# Patient Record
Sex: Male | Born: 1965 | Race: White | Hispanic: No | Marital: Married | Smoking: Current every day smoker
Health system: Southern US, Community
[De-identification: ages and names within clinical notes are randomized; demographics above are authoritative.]

## PROBLEM LIST (undated history)

## (undated) DIAGNOSIS — I10 Essential (primary) hypertension: Secondary | ICD-10-CM

## (undated) DIAGNOSIS — F988 Other specified behavioral and emotional disorders with onset usually occurring in childhood and adolescence: Secondary | ICD-10-CM

## (undated) DIAGNOSIS — F419 Anxiety disorder, unspecified: Secondary | ICD-10-CM

## (undated) DIAGNOSIS — M1990 Unspecified osteoarthritis, unspecified site: Secondary | ICD-10-CM

## (undated) DIAGNOSIS — D649 Anemia, unspecified: Secondary | ICD-10-CM

## (undated) DIAGNOSIS — T7840XA Allergy, unspecified, initial encounter: Secondary | ICD-10-CM

## (undated) HISTORY — DX: Allergy, unspecified, initial encounter: T78.40XA

## (undated) HISTORY — DX: Unspecified osteoarthritis, unspecified site: M19.90

## (undated) HISTORY — DX: Other specified behavioral and emotional disorders with onset usually occurring in childhood and adolescence: F98.8

## (undated) HISTORY — DX: Anemia, unspecified: D64.9

## (undated) HISTORY — DX: Anxiety disorder, unspecified: F41.9

---

## 2008-09-26 ENCOUNTER — Encounter: Admission: RE | Admit: 2008-09-26 | Discharge: 2008-09-26 | Payer: Self-pay | Admitting: Geriatric Medicine

## 2009-04-25 ENCOUNTER — Emergency Department (HOSPITAL_COMMUNITY): Admission: EM | Admit: 2009-04-25 | Discharge: 2009-04-26 | Payer: Self-pay | Admitting: Emergency Medicine

## 2014-01-05 ENCOUNTER — Ambulatory Visit (INDEPENDENT_AMBULATORY_CARE_PROVIDER_SITE_OTHER): Payer: BC Managed Care – PPO | Admitting: Emergency Medicine

## 2014-01-05 VITALS — BP 138/100 | HR 127 | Temp 99.1°F | Resp 18 | Ht 65.0 in | Wt 143.0 lb

## 2014-01-05 DIAGNOSIS — IMO0001 Reserved for inherently not codable concepts without codable children: Secondary | ICD-10-CM

## 2014-01-05 DIAGNOSIS — R030 Elevated blood-pressure reading, without diagnosis of hypertension: Secondary | ICD-10-CM

## 2014-01-05 DIAGNOSIS — J209 Acute bronchitis, unspecified: Secondary | ICD-10-CM

## 2014-01-05 DIAGNOSIS — J0140 Acute pansinusitis, unspecified: Secondary | ICD-10-CM

## 2014-01-05 MED ORDER — AMOXICILLIN-POT CLAVULANATE 875-125 MG PO TABS: 1.0000 | ORAL_TABLET | Freq: Two times a day (BID) | ORAL | Status: DC

## 2014-01-05 MED ORDER — HYDROCOD POLST-CHLORPHEN POLST 10-8 MG/5ML PO LQCR: 5.0000 mL | Freq: Two times a day (BID) | ORAL | Status: DC | PRN

## 2014-01-05 NOTE — Patient Instructions (Signed)
Avpod sudafed containing decongestants   Hypertension Hypertension, commonly called high blood pressure, is when the force of blood pumping through your arteries is too strong. Your arteries are the blood vessels that carry blood from your heart throughout your body. A blood pressure reading consists of a higher number over a lower number, such as 110/72. The higher number (systolic) is the pressure inside your arteries when your heart pumps. The lower number (diastolic) is the pressure inside your arteries when your heart relaxes. Ideally you want your blood pressure below 120/80. Hypertension forces your heart to work harder to pump blood. Your arteries may become narrow or stiff. Having hypertension puts you at risk for heart disease, stroke, and other problems.  RISK FACTORS Some risk factors for high blood pressure are controllable. Others are not.  Risk factors you cannot control include:   Race. You may be at higher risk if you are African American.  Age. Risk increases with age.  Gender. Men are at higher risk than women before age 46 years. After age 46, women are at higher risk than men. Risk factors you can control include:  Not getting enough exercise or physical activity.  Being overweight.  Getting too much fat, sugar, calories, or salt in your diet.  Drinking too much alcohol. SIGNS AND SYMPTOMS Hypertension does not usually cause signs or symptoms. Extremely high blood pressure (hypertensive crisis) may cause headache, anxiety, shortness of breath, and nosebleed. DIAGNOSIS  To check if you have hypertension, your health care provider will measure your blood pressure while you are seated, with your arm held at the level of your heart. It should be measured at least twice using the same arm. Certain conditions can cause a difference in blood pressure between your right and left arms. A blood pressure reading that is higher than normal on one occasion does not mean that you  need treatment. If one blood pressure reading is high, ask your health care provider about having it checked again. TREATMENT  Treating high blood pressure includes making lifestyle changes and possibly taking medicine. Living a healthy lifestyle can help lower high blood pressure. You may need to change some of your habits. Lifestyle changes may include:  Following the DASH diet. This diet is high in fruits, vegetables, and whole grains. It is low in salt, red meat, and added sugars.  Getting at least 2 hours of brisk physical activity every week.  Losing weight if necessary.  Not smoking.  Limiting alcoholic beverages.  Learning ways to reduce stress. If lifestyle changes are not enough to get your blood pressure under control, your health care provider may prescribe medicine. You may need to take more than one. Work closely with your health care provider to understand the risks and benefits. HOME CARE INSTRUCTIONS  Have your blood pressure rechecked as directed by your health care provider.   Take medicines only as directed by your health care provider. Follow the directions carefully. Blood pressure medicines must be taken as prescribed. The medicine does not work as well when you skip doses. Skipping doses also puts you at risk for problems.   Do not smoke.   Monitor your blood pressure at home as directed by your health care provider. SEEK MEDICAL CARE IF:   You think you are having a reaction to medicines taken.  You have recurrent headaches or feel dizzy.  You have swelling in your ankles.  You have trouble with your vision. SEEK IMMEDIATE MEDICAL CARE IF:  You  develop a severe headache or confusion.  You have unusual weakness, numbness, or feel faint.  You have severe chest or abdominal pain.  You vomit repeatedly.  You have trouble breathing. MAKE SURE YOU:   Understand these instructions.  Will watch your condition.  Will get help right away if you  are not doing well or get worse. Document Released: 12/31/2004 Document Revised: 05/17/2013 Document Reviewed: 10/23/2012 [facility] Patient Information 2015 [place], [place]. This information is not intended to replace advice given to you by your health care provider. Make sure you discuss any questions you have with your health care provider.

## 2014-01-05 NOTE — Progress Notes (Signed)
[facility] [address], [place] [id number] [phone]  Date:  01/05/2014   Name:  Walter DayMichael Jeff   DOB:  Apr 13, 1965   MRN:  [id number]  PCP:  No PCP Per Patient    Chief Complaint: Cough and Nasal Congestion   History of Present Illness:  Walter Snow is a 48 y.o. very pleasant male patient who presents with the following:  Has three day history of sudden purulent nasal drainage and post nasal drip.  No fever or chills Has a sore throat Cough is productive of purulent sputum.  No wheezing or shortness of breath. No nausea o r vomiting Using sudafed in large amounts. Denies other complaint or health concern today.   There are no active problems to display for this patient.   Past Medical History  Diagnosis Date  . Allergy   . Anemia   . Anxiety   . Arthritis     History reviewed. No pertinent past surgical history.  History  Substance Use Topics  . Smoking status: Current Every Day Smoker -- 1.00 packs/day for 30 years    Types: Cigarettes  . Smokeless tobacco: Not on file  . Alcohol Use: 0.0 oz/week    0 Not specified per week    Family History  Problem Relation Age of Onset  . Cancer Father   . Heart disease Father     Allergies  Allergen Reactions  . Penicillins Hives    Medication list has been reviewed and updated.  No current outpatient prescriptions on file prior to visit.   No current facility-administered medications on file prior to visit.    Review of Systems:  As per HPI, otherwise negative.    Physical Examination: Filed Vitals:   01/05/14 1350  BP: 138/100  Pulse: 127  Temp: 99.1 F (37.3 C)  Resp: 18   Filed Vitals:   01/05/14 1350  Height: 5\' 5"  (1.651 m)  Weight: 143 lb (64.864 kg)   Body mass index is 23.8 kg/(m^2). Ideal Body Weight: Weight in (lb) to have BMI = 25: 149.9  GEN: WDWN, NAD, Non-toxic, A & O x 3  persistent cough HEENT: Atraumatic, Normocephalic. Neck supple. No masses,  No LAD. Ears and Nose: No external deformity. CV: RRR, No M/G/R. No JVD. No thrill. No extra heart sounds. PULM: CTA B, no wheezes, crackles, rhonchi. No retractions. No resp. distress. No accessory muscle use. ABD: S, NT, ND, +BS. No rebound. No HSM. EXTR: No c/c/e NEURO Normal gait.  PSYCH: Normally interactive. Conversant. Not depressed or anxious appearing.  Calm demeanor.    Assessment and Plan: Elevated BP could be due sympathomimetics such as sudafed Sinusitis Broncitis.  augmentin tussionex To 104  Signed,  Phillips OdorJeffery Dimitrious Micciche, MD

## 2014-01-05 NOTE — Addendum Note (Signed)
Addended by: Carmelina DaneANDERSON, Brandye Inthavong S on: 01/05/2014 03:07 PM   Modules accepted: Level of Service

## 2014-01-11 NOTE — Progress Notes (Signed)
Left message for patient to call back to schedule with a primary care provider to establish care.

## 2014-01-19 ENCOUNTER — Encounter: Payer: Self-pay | Admitting: Family Medicine

## 2014-01-19 ENCOUNTER — Ambulatory Visit (INDEPENDENT_AMBULATORY_CARE_PROVIDER_SITE_OTHER): Payer: BLUE CROSS/BLUE SHIELD | Admitting: Family Medicine

## 2014-01-19 VITALS — BP 149/97 | HR 106 | Temp 98.4°F | Resp 18 | Ht 65.5 in | Wt 144.2 lb

## 2014-01-19 DIAGNOSIS — F17200 Nicotine dependence, unspecified, uncomplicated: Secondary | ICD-10-CM

## 2014-01-19 DIAGNOSIS — Z720 Tobacco use: Secondary | ICD-10-CM

## 2014-01-19 DIAGNOSIS — I10 Essential (primary) hypertension: Secondary | ICD-10-CM

## 2014-01-19 NOTE — Patient Instructions (Signed)
Come in for blood work in the next couple of days

## 2014-01-21 DIAGNOSIS — I10 Essential (primary) hypertension: Secondary | ICD-10-CM | POA: Insufficient documentation

## 2014-01-21 DIAGNOSIS — F17200 Nicotine dependence, unspecified, uncomplicated: Secondary | ICD-10-CM | POA: Insufficient documentation

## 2014-01-21 NOTE — Progress Notes (Signed)
Subjective:    Patient ID: Walter Snow, male    DOB: [birth year], 49 y.o.   MRN: [id number]  HPI Patient presents today for follow up of elevated BP. He was seen 12/23 for URI and was noted to have an elevated BP. He has had elevated readings in the past. He has a strong family history of elevated BP. He has tried limiting salt intake without improvement in his BP. He rarely eats out and prepares most of his food from scratch.   He works at [employer] and estimates he walks about 5 miles a day. He is a longtime smoker. He occasionally takes adderall for ADHD. 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 He rarely takes it, and reports he is able to function without difficulty at work and his relationships are currently healthy. He rarely uses ativan for sleep. Is currently sleeping well and stress level is manageable.    Past Medical History  Diagnosis Date  . Allergy   . Anemia   . Anxiety   . Arthritis    History reviewed. No pertinent past surgical history. Family History  Problem Relation Age of Onset  . Cancer Father   . Heart disease Father    History  Substance Use Topics  . Smoking status: Current Every Day Smoker -- 1.00 packs/day for 30 years    Types: Cigarettes  . Smokeless tobacco: Not on file  . Alcohol Use: 0.0 oz/week    0 Not specified per week    Review of Systems  Constitutional: Negative for fatigue and unexpected weight change.  Respiratory: Negative for cough, chest tightness, shortness of breath and wheezing.   Cardiovascular: Negative for chest pain, palpitations and leg swelling.  Neurological: Negative for headaches.  Psychiatric/Behavioral: Negative for dysphoric mood. The patient is not nervous/anxious and is not hyperactive.       Objective:   Physical Exam  Constitutional: He is oriented to person, place, and time. He appears  well-developed and well-nourished.  HENT:  Head: Normocephalic and atraumatic.  Right Ear: External ear normal.  Left Ear: External ear normal.  Nose: Nose normal.  Mouth/Throat: Oropharynx is clear and moist.  Eyes: Conjunctivae are normal.  Neck: Normal range of motion. Neck supple. No JVD present. No thyromegaly present.  Cardiovascular: Normal rate, regular rhythm and normal heart sounds.   Pulmonary/Chest: Effort normal and breath sounds normal.  Musculoskeletal: Normal range of motion.  Lymphadenopathy:    He has no cervical adenopathy.  Neurological: He is alert and oriented to person, place, and time.  Skin: Skin is warm and dry.  Psychiatric: He has a normal mood and affect. His behavior is normal. Judgment and thought content normal.  Vitals reviewed. BP 149/97 mmHg  Pulse 106  Temp(Src) 98.4 F (36.9 C) (Oral)  Resp 18  Ht 5' 5.5" (1.664 m)  Wt 144 lb 3.2 oz (65.409 kg)  BMI 23.62 kg/m2  SpO2 100% Recheck BP 158/98    Assessment & Plan:  1. Essential hypertension - Patient not fasting, will come in tomorrow or Friday for fasting labs, after reviewing labs, will start antihypertensive agent -Discussed side effects of Adderall and encouraged patient to discontinue since he is functioning well when he doesn't take it. - CBC; Future - Comprehensive metabolic panel; Future - TSH; Future - Lipid panel; Future  2. Tobacco use disorder - CBC; Future - Comprehensive  metabolic panel; Future - TSH; Future - Lipid panel; Future - Encouraged smoking cessation  -f/u 3 months Emi Belfasteborah B. Harlo Jaso, FNP-BC  [facility]  01/21/2014 9:55 AM

## 2014-01-26 ENCOUNTER — Other Ambulatory Visit (INDEPENDENT_AMBULATORY_CARE_PROVIDER_SITE_OTHER): Payer: BLUE CROSS/BLUE SHIELD | Admitting: Family Medicine

## 2014-01-26 DIAGNOSIS — F17200 Nicotine dependence, unspecified, uncomplicated: Secondary | ICD-10-CM

## 2014-01-26 DIAGNOSIS — I10 Essential (primary) hypertension: Secondary | ICD-10-CM

## 2014-01-26 LAB — COMPREHENSIVE METABOLIC PANEL
ALBUMIN: 4 g/dL (ref 3.5–5.2)
ALK PHOS: 96 U/L (ref 39–117)
ALT: 17 U/L (ref 0–53)
AST: 24 U/L (ref 0–37)
BILIRUBIN TOTAL: 0.6 mg/dL (ref 0.2–1.2)
BUN: 20 mg/dL (ref 6–23)
CO2: 29 meq/L (ref 19–32)
Calcium: 9.8 mg/dL (ref 8.4–10.5)
Chloride: 103 mEq/L (ref 96–112)
Creat: 1.05 mg/dL (ref 0.50–1.35)
GLUCOSE: 79 mg/dL (ref 70–99)
POTASSIUM: 5.3 meq/L (ref 3.5–5.3)
SODIUM: 138 meq/L (ref 135–145)
TOTAL PROTEIN: 7 g/dL (ref 6.0–8.3)

## 2014-01-26 LAB — CBC
HEMATOCRIT: 43.7 % (ref 39.0–52.0)
HEMOGLOBIN: 15 g/dL (ref 13.0–17.0)
MCH: 32.3 pg (ref 26.0–34.0)
MCHC: 34.3 g/dL (ref 30.0–36.0)
MCV: 94 fL (ref 78.0–100.0)
MPV: 10.4 fL (ref 8.6–12.4)
Platelets: 264 10*3/uL (ref 150–400)
RBC: 4.65 MIL/uL (ref 4.22–5.81)
RDW: 12.8 % (ref 11.5–15.5)
WBC: 6.1 10*3/uL (ref 4.0–10.5)

## 2014-01-26 LAB — LIPID PANEL
CHOL/HDL RATIO: 5.4 ratio
CHOLESTEROL: 223 mg/dL — AB (ref 0–200)
HDL: 41 mg/dL (ref >39)
LDL Cholesterol: 134 mg/dL — ABNORMAL HIGH (ref 0–99)
Triglycerides: 242 mg/dL — ABNORMAL HIGH (ref <150)
VLDL: 48 mg/dL — AB (ref 0–40)

## 2014-01-26 LAB — TSH: TSH: 2.842 u[IU]/mL (ref 0.350–4.500)

## 2014-01-27 ENCOUNTER — Other Ambulatory Visit: Payer: Self-pay | Admitting: Family Medicine

## 2014-01-27 DIAGNOSIS — I10 Essential (primary) hypertension: Secondary | ICD-10-CM

## 2014-01-27 MED ORDER — LISINOPRIL 10 MG PO TABS: 10.0000 mg | ORAL_TABLET | Freq: Every day | ORAL | Status: DC

## 2014-08-29 ENCOUNTER — Encounter (HOSPITAL_COMMUNITY): Payer: Self-pay | Admitting: Emergency Medicine

## 2014-08-29 ENCOUNTER — Emergency Department (HOSPITAL_COMMUNITY)
Admission: EM | Admit: 2014-08-29 | Discharge: 2014-08-29 | Disposition: A | Discharge status: Home / Self Care | Payer: Self-pay | Attending: Emergency Medicine | Admitting: Emergency Medicine

## 2014-08-29 DIAGNOSIS — Z23 Encounter for immunization: Secondary | ICD-10-CM | POA: Insufficient documentation

## 2014-08-29 DIAGNOSIS — S61012A Laceration without foreign body of left thumb without damage to nail, initial encounter: Secondary | ICD-10-CM | POA: Insufficient documentation

## 2014-08-29 DIAGNOSIS — W260XXA Contact with knife, initial encounter: Secondary | ICD-10-CM | POA: Insufficient documentation

## 2014-08-29 DIAGNOSIS — I10 Essential (primary) hypertension: Secondary | ICD-10-CM | POA: Insufficient documentation

## 2014-08-29 DIAGNOSIS — Z79899 Other long term (current) drug therapy: Secondary | ICD-10-CM | POA: Insufficient documentation

## 2014-08-29 DIAGNOSIS — Z862 Personal history of diseases of the blood and blood-forming organs and certain disorders involving the immune mechanism: Secondary | ICD-10-CM | POA: Insufficient documentation

## 2014-08-29 DIAGNOSIS — Z720 Tobacco use: Secondary | ICD-10-CM | POA: Insufficient documentation

## 2014-08-29 DIAGNOSIS — Z880 Allergy status to penicillin: Secondary | ICD-10-CM | POA: Insufficient documentation

## 2014-08-29 DIAGNOSIS — Y998 Other external cause status: Secondary | ICD-10-CM | POA: Insufficient documentation

## 2014-08-29 DIAGNOSIS — Y9389 Activity, other specified: Secondary | ICD-10-CM | POA: Insufficient documentation

## 2014-08-29 DIAGNOSIS — F419 Anxiety disorder, unspecified: Secondary | ICD-10-CM | POA: Insufficient documentation

## 2014-08-29 DIAGNOSIS — Y9289 Other specified places as the place of occurrence of the external cause: Secondary | ICD-10-CM | POA: Insufficient documentation

## 2014-08-29 HISTORY — DX: Essential (primary) hypertension: I10

## 2014-08-29 MED ORDER — LIDOCAINE HCL (PF) 1 % IJ SOLN
5.0000 mL | Freq: Once | INTRAMUSCULAR | Status: AC
  Administered 2014-08-29: 5 mL
  Filled 2014-08-29: qty 5

## 2014-08-29 MED ORDER — TETANUS-DIPHTH-ACELL PERTUSSIS 5-2.5-18.5 LF-MCG/0.5 IM SUSP
0.5000 mL | Freq: Once | INTRAMUSCULAR | Status: AC
  Administered 2014-08-29: 0.5 mL via INTRAMUSCULAR
  Filled 2014-08-29: qty 0.5

## 2014-08-29 NOTE — ED Notes (Signed)
Patient states he was cutting wood with a utility knife and cut the left thumb. Patient denies any blood thinner. Patient alert& O x4 . Bleeding controlled. Patient able to still move extremity. Laceration noted about 1.5 cm.

## 2014-08-29 NOTE — ED Provider Notes (Signed)
CSN: [id number]     Arrival date & time 08/29/14  1721 History  This chart was scribed for Zadie Rhine, MD by Leone Payor, ED Scribe. This patient was seen in room TR06C/TR06C and the patient's care was started 5:35 PM.    Chief Complaint  Patient presents with  . Extremity Laceration   The history is provided by the patient. No language interpreter was used.     HPI Comments: Walter Snow is a 49 y.o. male who presents to the Emergency Department complaining of a laceration to the left thumb that occurred PTA. He reports using a utility knife when the injury occurred. He reports associated mild pain to the affected area which is worse with touch. He does not take anti-coagulants. His tetanus is out of date. Bleeding is controlled. He denies numbness, weakness.   Past Medical History  Diagnosis Date  . Allergy   . Anemia   . Anxiety   . Arthritis   . Hypertension    History reviewed. No pertinent past surgical history. Family History  Problem Relation Age of Onset  . Cancer Father   . Heart disease Father    Social History  Substance Use Topics  . Smoking status: Current Every Day Smoker -- 1.00 packs/day for 30 years    Types: Cigarettes  . Smokeless tobacco: None  . Alcohol Use: 0.0 oz/week    0 Standard drinks or equivalent per week    Review of Systems  Skin: Positive for wound (laceration to left thumb).  Neurological: Negative for weakness and numbness.      Allergies  Penicillins  Home Medications   Prior to Admission medications   Medication Sig Start Date End Date Taking? Authorizing Provider  amphetamine-dextroamphetamine (ADDERALL) 10 MG tablet Take 10 mg by mouth 2 (two) times daily with a meal.    Historical Provider, MD  lisinopril (PRINIVIL,ZESTRIL) 10 MG tablet Take 1 tablet (10 mg total) by mouth daily. 01/27/14   Emi Belfast, FNP  LORazepam (ATIVAN) 0.5 MG tablet Take 0.5 mg by mouth every 8 (eight) hours.    Historical Provider, MD    There were no vitals taken for this visit. Physical Exam  Nursing note and vitals reviewed.  CONSTITUTIONAL: Well developed/well nourished HEAD: Normocephalic/atraumatic ENMT: Mucous membranes moist NECK: supple no meningeal signs CV: S1/S2 noted, no murmurs/rubs/gallops noted LUNGS: Lungs are clear to auscultation bilaterally, no apparent distress ABDOMEN: soft, nontender, no rebound or guarding, bowel sounds noted throughout abdomen NEURO: Pt is awake/alert/appropriate, moves all extremitiesx4.  No facial droop.   EXTREMITIES: pulses normal/equal, full ROM of the left thumb SKIN: warm, color normal, laceration to left thumb, bleeding controlled.  PSYCH: no abnormalities of mood noted, alert and oriented to situation  ED Course  Procedures   DIAGNOSTIC STUDIES: Oxygen Saturation is 97% on RA, adequate by my interpretation.    COORDINATION OF CARE: 5:40 PM Will perform laceration repair. Discussed treatment plan with pt at bedside and pt agreed to plan.  6:03 PM LACERATION REPAIR PROCEDURE NOTE The patient's identification was confirmed and consent was obtained. This procedure was performed by Zadie Rhine, MD at 6:03 PM. Site: left thumb Sterile procedures observed Anesthetic used (type and amt): lidocaine 1% without epi, 4 cc Suture type/size: prolene Length: 1.5 cm  # of Sutures: 4 Technique:simple interrupted  Simple Tetanus ordered  Site anesthetized, irrigated with tap water explored without evidence of foreign body, wound well approximated, site covered with dry, sterile dressing.  Patient tolerated procedure  well without complications. Instructions for care discussed verbally and patient provided with additional written instructions for homecare and f/u.      MDM   Final diagnoses:  Laceration of left thumb, initial encounter    Nursing notes including past medical history and social history reviewed and considered in documentation   I personally  performed the services described in this documentation, which was scribed in my presence. The recorded information has been reviewed and is accurate.      Zadie Rhine, MD 08/29/14 [phone]

## 2014-08-29 NOTE — ED Notes (Signed)
MD at bedside. 

## 2014-08-30 ENCOUNTER — Other Ambulatory Visit: Payer: Self-pay | Admitting: Family Medicine

## 2014-09-05 ENCOUNTER — Encounter: Payer: Self-pay | Admitting: Family Medicine

## 2014-09-05 ENCOUNTER — Ambulatory Visit (INDEPENDENT_AMBULATORY_CARE_PROVIDER_SITE_OTHER): Payer: Self-pay | Admitting: Family Medicine

## 2014-09-05 VITALS — BP 134/86 | HR 90 | Temp 98.5°F | Resp 18 | Ht 63.75 in | Wt 144.8 lb

## 2014-09-05 DIAGNOSIS — F17200 Nicotine dependence, unspecified, uncomplicated: Secondary | ICD-10-CM

## 2014-09-05 DIAGNOSIS — I10 Essential (primary) hypertension: Secondary | ICD-10-CM

## 2014-09-05 DIAGNOSIS — Z720 Tobacco use: Secondary | ICD-10-CM

## 2014-09-05 DIAGNOSIS — K219 Gastro-esophageal reflux disease without esophagitis: Secondary | ICD-10-CM

## 2014-09-05 MED ORDER — LISINOPRIL 10 MG PO TABS: 10.0000 mg | ORAL_TABLET | Freq: Every day | ORAL | Status: DC

## 2014-09-05 NOTE — Patient Instructions (Addendum)
Start a daily antihistamine for nasal drainage For stomach- take over the counter H2 blocker- zantac or pepcid (generic fine)   Gastroesophageal Reflux Disease, Adult Gastroesophageal reflux disease (GERD) happens when acid from your stomach flows up into the esophagus. When acid comes in contact with the esophagus, the acid causes soreness (inflammation) in the esophagus. Over time, GERD may create small holes (ulcers) in the lining of the esophagus. CAUSES   Increased body weight. This puts pressure on the stomach, making acid rise from the stomach into the esophagus.  Smoking. This increases acid production in the stomach.  Drinking alcohol. This causes decreased pressure in the lower esophageal sphincter (valve or ring of muscle between the esophagus and stomach), allowing acid from the stomach into the esophagus.  Late evening meals and a full stomach. This increases pressure and acid production in the stomach.  A malformed lower esophageal sphincter. Sometimes, no cause is found. SYMPTOMS   Burning pain in the lower part of the mid-chest behind the breastbone and in the mid-stomach area. This may occur twice a week or more often.  Trouble swallowing.  Sore throat.  Dry cough.  Asthma-like symptoms including chest tightness, shortness of breath, or wheezing. DIAGNOSIS  Your caregiver may be able to diagnose GERD based on your symptoms. In some cases, X-rays and other tests may be done to check for complications or to check the condition of your stomach and esophagus. TREATMENT  Your caregiver may recommend over-the-counter or prescription medicines to help decrease acid production. Ask your caregiver before starting or adding any new medicines.  HOME CARE INSTRUCTIONS   Change the factors that you can control. Ask your caregiver for guidance concerning weight loss, quitting smoking, and alcohol consumption.  Avoid foods and drinks that make your symptoms worse, such  as:  Caffeine or alcoholic drinks.  Chocolate.  Peppermint or mint flavorings.  Garlic and onions.  Spicy foods.  Citrus fruits, such as oranges, lemons, or limes.  Tomato-based foods such as sauce, chili, salsa, and pizza.  Fried and fatty foods.  Avoid lying down for the 3 hours prior to your bedtime or prior to taking a nap.  Eat small, frequent meals instead of large meals.  Wear loose-fitting clothing. Do not wear anything tight around your waist that causes pressure on your stomach.  Raise the head of your bed 6 to 8 inches with wood blocks to help you sleep. Extra pillows will not help.  Only take over-the-counter or prescription medicines for pain, discomfort, or fever as directed by your caregiver.  Do not take aspirin, ibuprofen, or other nonsteroidal anti-inflammatory drugs (NSAIDs). SEEK IMMEDIATE MEDICAL CARE IF:   You have pain in your arms, neck, jaw, teeth, or back.  Your pain increases or changes in intensity or duration.  You develop nausea, vomiting, or sweating (diaphoresis).  You develop shortness of breath, or you faint.  Your vomit is green, yellow, black, or looks like coffee grounds or blood.  Your stool is red, bloody, or black. These symptoms could be signs of other problems, such as heart disease, gastric bleeding, or esophageal bleeding. MAKE SURE YOU:   Understand these instructions.  Will watch your condition.  Will get help right away if you are not doing well or get worse. Document Released: 10/10/2004 Document Revised: 03/25/2011 Document Reviewed: 07/20/2010 [facility] Patient Information 2015 [place], [place]. This information is not intended to replace advice given to you by your health care provider. Make sure you discuss any questions you  have with your health care provider.  

## 2014-09-05 NOTE — Progress Notes (Signed)
   Subjective:    Patient ID: Walter Snow, male    DOB: 01/25/1965, 49 y.o.   MRN: [id number]  HPI Patient presents for follow up of HTN. He was started on lisinopril 1/16 and has tolerated well.   He had a laceration of left thumb 08/29/14 and had 3 sutures placed. Two have fallen out and the third is ready to come out.   He and his wife are contemplating smoking cessation.   He has been off and on PPI treatment for GERD for many years. Has increased symptoms with heavy, fatty, fried foods and at night. Has symptoms a couple of times a week. Has had more post nasal drainage this summer and this exacerbates his reflux symptoms.   Review of Systems No chest pain, no SOB, no fever/chills,     Objective:   Physical Exam Physical Exam  Constitutional: Oriented to person, place, and time. He appears well-developed and well-nourished.  HENT:  Head: Normocephalic and atraumatic.  Eyes: Conjunctivae are normal.  Neck: Normal range of motion. Neck supple.  Cardiovascular: Normal rate, regular rhythm and normal heart sounds.   Pulmonary/Chest: Effort normal and breath sounds normal.  Musculoskeletal: Normal range of motion. No edema.  Neurological: Alert and oriented to person, place, and time.  Skin: Skin is warm and dry. Left thumb with healing V shaped laceration. Edges well approximated. Remaining suture removed.  Psychiatric: Normal mood and affect. Behavior is normal. Judgment and thought content normal.  Vitals reviewed.  BP 134/86 mmHg  Pulse 90  Temp(Src) 98.5 F (36.9 C) (Oral)  Resp 18  Ht 5' 3.75" (1.619 m)  Wt 144 lb 12.8 oz (65.681 kg)  BMI 25.06 kg/m2  SpO2 100% Wt Readings from Last 3 Encounters:  09/05/14 144 lb 12.8 oz (65.681 kg)  01/19/14 144 lb 3.2 oz (65.409 kg)  01/05/14 143 lb (64.864 kg)      Assessment & Plan:  1. Essential hypertension - lisinopril (PRINIVIL,ZESTRIL) 10 MG tablet; Take 1 tablet (10 mg total) by mouth daily.  Dispense: 90 tablet;  Refill: 1  2. Gastroesophageal reflux disease, esophagitis presence not specified - Provided written and verbal information regarding diagnosis and treatment. - suggested OTC H2 blocker and long acting antihistamine  3. Tobacco use disorder - provided verbal and written tips for smoking cessation  - follow up in 6 months, will check labs at that time  Olean Ree, FNP-BC  [facility]  09/08/2014 8:12 AM

## 2015-03-08 ENCOUNTER — Ambulatory Visit (INDEPENDENT_AMBULATORY_CARE_PROVIDER_SITE_OTHER): Payer: Managed Care, Other (non HMO) | Admitting: Family Medicine

## 2015-03-08 ENCOUNTER — Encounter: Payer: Self-pay | Admitting: Family Medicine

## 2015-03-08 VITALS — BP 131/85 | HR 97 | Temp 98.3°F | Resp 16 | Ht 63.75 in | Wt 146.0 lb

## 2015-03-08 DIAGNOSIS — I10 Essential (primary) hypertension: Secondary | ICD-10-CM | POA: Diagnosis not present

## 2015-03-08 DIAGNOSIS — F17200 Nicotine dependence, unspecified, uncomplicated: Secondary | ICD-10-CM | POA: Diagnosis not present

## 2015-03-08 DIAGNOSIS — N529 Male erectile dysfunction, unspecified: Secondary | ICD-10-CM | POA: Diagnosis not present

## 2015-03-08 DIAGNOSIS — L719 Rosacea, unspecified: Secondary | ICD-10-CM

## 2015-03-08 DIAGNOSIS — R6882 Decreased libido: Secondary | ICD-10-CM | POA: Diagnosis not present

## 2015-03-08 DIAGNOSIS — E785 Hyperlipidemia, unspecified: Secondary | ICD-10-CM

## 2015-03-08 MED ORDER — LISINOPRIL 10 MG PO TABS: 10.0000 mg | ORAL_TABLET | Freq: Every day | ORAL | Status: DC

## 2015-03-08 MED ORDER — METRONIDAZOLE 0.75 % EX GEL: 1.0000 "application " | Freq: Two times a day (BID) | CUTANEOUS | Status: DC

## 2015-03-08 NOTE — Progress Notes (Signed)
Subjective:    Patient ID: Walter Snow, male    DOB: 09/28/1965, 50 y.o.   MRN: [id number]  HPI This is a pleasant 50 yo male who presents today for follow up of BP.  He has done well on lisinopril. Decided to stop Adderall due to potential side effects.   Elevated lipids at last visit. Eats fast food at least once a day. [employer] daily for lunch. Some breakfast fast food. Works at [employer] and fast food is convenient. Cooks a balanced dinner.   Has long history of rosacea. Needs refill of metrogel which works well for him.   Has had some decreased libido. Difficulty maintaining an erection. Given a prescription for cialis, but found it to be cost prohibitive. Got script from psychiatrist that he sees occasionally for marriage counseling.     Smokes 1 ppd. Continues to have nasal drainage, better with otc antihistamine. Non productive cough, no wheeze.   Past Medical History  Diagnosis Date  . Allergy   . Anemia   . Anxiety   . Arthritis   . Hypertension   . ADD (attention deficit disorder)   No past surgical history on file. Family History  Problem Relation Age of Onset  . Cancer Father   . Heart disease Father    Social History  Substance Use Topics  . Smoking status: Current Every Day Smoker -- 1.00 packs/day for 30 years    Types: Cigarettes  . Smokeless tobacco: None  . Alcohol Use: 0.0 oz/week    0 Standard drinks or equivalent per week     Review of Systems No chest pain, no sob, no edema, no palpitations.     Objective:   Physical Exam Physical Exam  Constitutional: Oriented to person, place, and time. He appears well-developed and well-nourished.  HENT:  Head: Normocephalic and atraumatic.  Eyes: Conjunctivae are normal.  Neck: Normal range of motion. Neck supple.  Cardiovascular: Normal rate, regular rhythm and normal heart sounds.   Pulmonary/Chest: Effort normal and breath sounds normal.  Musculoskeletal: Normal range of motion.  Neurological:  Alert and oriented to person, place, and time.  Skin: Skin is warm and dry.  Psychiatric: Normal mood and affect. Behavior is normal. Judgment and thought content normal.  Vitals reviewed.  BP 131/85 mmHg  Pulse 97  Temp(Src) 98.3 F (36.8 C) (Oral)  Resp 16  Ht 5' 3.75" (1.619 m)  Wt 146 lb (66.225 kg)  BMI 25.27 kg/m2  SpO2 97% Wt Readings from Last 3 Encounters:  03/08/15 146 lb (66.225 kg)  09/05/14 144 lb 12.8 oz (65.681 kg)  01/19/14 144 lb 3.2 oz (65.409 kg)       Assessment & Plan:  1. Essential hypertension - well contolled - lisinopril (PRINIVIL,ZESTRIL) 10 MG tablet; Take 1 tablet (10 mg total) by mouth daily.  Dispense: 90 tablet; Refill: 1 - Basic metabolic panel; Future  2. Tobacco use disorder - Encouraged smoking cessation   3. Elevated lipids - discussed diet and need to decrease fast food intake  - Lipid panel; Future  4. Rosacea - metroNIDAZOLE (METROGEL) 0.75 % gel; Apply 1 application topically 2 (two) times daily.  Dispense: 45 g; Refill: 0  5. Decreased libido - He will come in for morning, fasting labs. Discussed need for two low testosterone levels to diagnose hypogonadism.  - Testosterone; Future  6. Erectile dysfunction, unspecified erectile dysfunction type - he has a prescription for Cialis at home, I provided him with a coupon -  Testosterone; Future - follow up in 6 months  Olean Ree, FNP-BC  [facility], [facility]  03/12/2015 1:22 PM

## 2015-03-08 NOTE — Patient Instructions (Signed)
Please come in for fasting blood work. The appointment center is open Tuesday through Thursday at 7:45. You can also go to the walk in clinic any day.  Please call in July for a follow up appointment in August

## 2015-03-29 ENCOUNTER — Other Ambulatory Visit (INDEPENDENT_AMBULATORY_CARE_PROVIDER_SITE_OTHER): Payer: Managed Care, Other (non HMO)

## 2015-03-29 DIAGNOSIS — I10 Essential (primary) hypertension: Secondary | ICD-10-CM

## 2015-03-29 DIAGNOSIS — N529 Male erectile dysfunction, unspecified: Secondary | ICD-10-CM

## 2015-03-29 DIAGNOSIS — R6882 Decreased libido: Secondary | ICD-10-CM

## 2015-03-29 DIAGNOSIS — E785 Hyperlipidemia, unspecified: Secondary | ICD-10-CM

## 2015-03-29 LAB — LIPID PANEL
Cholesterol: 232 mg/dL — ABNORMAL HIGH (ref 125–200)
HDL: 39 mg/dL — AB (ref >=40)
LDL CALC: 151 mg/dL — AB (ref <130)
Total CHOL/HDL Ratio: 5.9 Ratio — ABNORMAL HIGH (ref <=5.0)
Triglycerides: 209 mg/dL — ABNORMAL HIGH (ref <150)
VLDL: 42 mg/dL — ABNORMAL HIGH (ref <30)

## 2015-03-29 LAB — BASIC METABOLIC PANEL
BUN: 14 mg/dL (ref 7–25)
CALCIUM: 9.5 mg/dL (ref 8.6–10.3)
CHLORIDE: 102 mmol/L (ref 98–110)
CO2: 29 mmol/L (ref 20–31)
CREATININE: 1.04 mg/dL (ref 0.60–1.35)
Glucose, Bld: 84 mg/dL (ref 65–99)
Potassium: 4.8 mmol/L (ref 3.5–5.3)
Sodium: 139 mmol/L (ref 135–146)

## 2015-03-29 LAB — TESTOSTERONE: TESTOSTERONE: 204 ng/dL — AB (ref 250–827)

## 2015-04-07 ENCOUNTER — Other Ambulatory Visit: Payer: Self-pay | Admitting: Family Medicine

## 2015-04-07 DIAGNOSIS — E785 Hyperlipidemia, unspecified: Secondary | ICD-10-CM

## 2015-04-07 DIAGNOSIS — R7989 Other specified abnormal findings of blood chemistry: Secondary | ICD-10-CM

## 2015-04-07 MED ORDER — ATORVASTATIN CALCIUM 20 MG PO TABS: 20.0000 mg | ORAL_TABLET | Freq: Every day | ORAL | Status: DC

## 2015-04-19 ENCOUNTER — Other Ambulatory Visit (INDEPENDENT_AMBULATORY_CARE_PROVIDER_SITE_OTHER): Payer: Managed Care, Other (non HMO)

## 2015-04-19 DIAGNOSIS — E291 Testicular hypofunction: Secondary | ICD-10-CM | POA: Diagnosis not present

## 2015-04-19 DIAGNOSIS — R7989 Other specified abnormal findings of blood chemistry: Secondary | ICD-10-CM

## 2015-04-19 LAB — FSH/LH
FSH: 4.1 m[IU]/mL (ref 1.6–8.0)
LH: 3 m[IU]/mL (ref 1.5–9.3)

## 2015-04-19 LAB — TESTOSTERONE: Testosterone: 156 ng/dL — ABNORMAL LOW (ref 250–827)

## 2015-04-26 ENCOUNTER — Telehealth: Payer: Self-pay

## 2015-04-26 ENCOUNTER — Other Ambulatory Visit: Payer: Self-pay | Admitting: Family Medicine

## 2015-04-26 DIAGNOSIS — E291 Testicular hypofunction: Secondary | ICD-10-CM

## 2015-04-26 DIAGNOSIS — R7989 Other specified abnormal findings of blood chemistry: Secondary | ICD-10-CM

## 2015-04-26 NOTE — Telephone Encounter (Signed)
I know you made some phone calls this morning.

## 2015-04-26 NOTE — Telephone Encounter (Signed)
Pt states [name] called and he think it was regarding a referral, the referrals didn't know anything about it. Please call [phone]

## 2015-04-26 NOTE — Telephone Encounter (Signed)
Please call the patient and let him know that I left him a message regarding his lab results- his testosterone level was again low, the hormone levels we checked were normal. I have put in a referral to endocrinology to further evaluate and treat.

## 2015-05-15 ENCOUNTER — Ambulatory Visit (INDEPENDENT_AMBULATORY_CARE_PROVIDER_SITE_OTHER): Payer: Managed Care, Other (non HMO) | Admitting: Endocrinology

## 2015-05-15 ENCOUNTER — Encounter: Payer: Self-pay | Admitting: Endocrinology

## 2015-05-15 VITALS — BP 122/84 | HR 84 | Temp 97.9°F | Resp 14 | Ht 63.75 in | Wt 148.6 lb

## 2015-05-15 DIAGNOSIS — E230 Hypopituitarism: Secondary | ICD-10-CM | POA: Diagnosis not present

## 2015-05-15 NOTE — Progress Notes (Signed)
Patient ID: Walter Snow, male   DOB: 17-Oct-1965, 50 y.o.   MRN: [id number]          Chief complaint: Decreased libido  Referring physician: Olean Ree  Reason for consultation: Low testosterone level  History of Present Illness  He has had complaints ofdecreased libido and erectile function for 6-12 months Although he does not have excessive fatigue, decreased motivation or depressed mood he thinks he is not as strong as he was with his arms. Initially had difficulties with sustaining erections but now because of his markedly decreased libido he is not having this issue He thinks that he is having less muscle mass and more fat on his body  The patient has a history of using anabolic steroids about 6-7 years ago for a year to help with multiple joint pains and stop taking pain medications.  He has not taken any such supplements for about 6-7 years now  There is no history of the following: Hot flushes, sweats, breast enlargement, history of testicular injury mumps in childhood or low impact fracture.  Prior lab results showtestosterone levels of:  Lab Results  Component Value Date   TESTOSTERONE 156* 04/19/2015   TESTOSTERONE 204* 03/29/2015    Prolactin level:Not available  Lab Results  Component Value Date   LH 3.0 04/19/2015       Testoserone supplements that hehas used include       Medication List       This list is accurate as of: 05/15/15 12:53 PM.  Always use your most recent med list.               atorvastatin 20 MG tablet  Commonly known as:  LIPITOR  Take 1 tablet (20 mg total) by mouth daily.     lisinopril 10 MG tablet  Commonly known as:  PRINIVIL,ZESTRIL  Take 1 tablet (10 mg total) by mouth daily.     LORazepam 0.5 MG tablet  Commonly known as:  ATIVAN  Take 0.5 mg by mouth every 8 (eight) hours.     metroNIDAZOLE 0.75 % gel  Commonly known as:  METROGEL  Apply 1 application topically 2 (two) times daily.         Allergies:  Allergies  Allergen Reactions  . Penicillins Hives    Past Medical History  Diagnosis Date  . Allergy   . Anemia   . Anxiety   . Arthritis   . Hypertension   . ADD (attention deficit disorder)     No past surgical history on file.  Family History  Problem Relation Age of Onset  . Cancer Father   . Heart disease Father   . Diabetes Paternal Grandmother   . Diabetes Paternal Aunt     Social History:  reports that he has been smoking Cigarettes.  He has a 30 pack-year smoking history. He does not have any smokeless tobacco history on file. He reports that he drinks alcohol. He reports that he does not use illicit drugs.  Review of Systems  Constitutional: Negative for weight loss, reduced appetite and malaise.  HENT: Positive for headaches and nasal congestion.        Has headaches only once a month, reportedly the form of migraine  Eyes: Positive for blurred vision.       Has difficulty with refractive error but peripheral vision is normal  Gastrointestinal: Positive for nausea.       May have some nausea in the morning only with having postnasal drip  Endocrine: Positive for decreased libido and erectile dysfunction. Negative for abnormal weight gain and light-headedness.  Musculoskeletal:       He will get joint pains only if he is doing certain kinds of work  Neurological: Positive for tingling. Negative for numbness.       Some tingling in the right hand and forearm      General Examination:   BP 122/84 mmHg  Pulse 84  Temp(Src) 97.9 F (36.6 C)  Resp 14  Ht 5' 3.75" (1.619 m)  Wt 148 lb 9.6 oz (67.405 kg)  BMI 25.72 kg/m2  SpO2 98%  total time GENERAL APPEARANCE  Averagely built and nourished SKIN:normal, no rash or pigmentation.  HEENT:Oral mucosa normal. Normal oropharyngeal passage  EYES:normal external appearance of eyes, Fundi benign. visual fields normal by confrontation   NECK:no lymphadenopathy, no  thyromegaly.  CHEST: Gynecomastia  Present bilaterally, mild with soft glandular tissue  LUNGS:clear to auscultation bilaterally, no wheezes, rhonchi, rales.  HEART:normal S1 And S2, no S3, S4, murmur or click.  ABDOMEN:no hepatosplenomegaly, no masses palpated, soft and not tender.   MALE GENITOURINARY:left testicle  3.5 -4 cm , softer and right testicle 3.5 cm , small epididymal swelling felt on the right side.   MUSCULOSKELETALNo enlargement or deformity of joints.  EXTREMITIES:no clubbing, no edema.  NEUROLOGIC EXAM: Biceps reflexes normal (2+) bilaterally.   Assessment/ Plan:  1. Hypogonadotropic hypogonadism    He has been symptomatic for a year, mostly with decreased libido and ED   He has significantly decreased testosterone levels with inappropriately low LH    He does need pituitary function evaluation before considering testosterone supplementation or other treatments  He will have labs ordered including free testosterone, prolactin, free T4 levels  Discussed various options for testosterone supplementation including transdermal gel or liquid preparations, Androderm patch, testosterone injections and clomiphene.   Discussed pros and cons of various treatments   2.  History of hyperlipidemia but no history of impaired fasting glucose, also has  Hypertension and may have formal metabolic syndrome  Indicating insulin resistance and propensity for hypogonadism   Aneeka Bowden 05/15/2015, 12:53 PM

## 2015-05-17 ENCOUNTER — Other Ambulatory Visit (INDEPENDENT_AMBULATORY_CARE_PROVIDER_SITE_OTHER): Payer: Managed Care, Other (non HMO)

## 2015-05-17 DIAGNOSIS — E230 Hypopituitarism: Secondary | ICD-10-CM | POA: Diagnosis not present

## 2015-05-17 LAB — LUTEINIZING HORMONE: LH: 4.82 m[IU]/mL (ref 1.50–9.30)

## 2015-05-17 LAB — T4, FREE: FREE T4: 0.63 ng/dL (ref 0.60–1.60)

## 2015-05-18 LAB — PROLACTIN: PROLACTIN: 7.8 ng/mL (ref 2.0–18.0)

## 2015-05-19 LAB — TESTOSTERONE, FREE, TOTAL, SHBG
SEX HORMONE BINDING: 26.9 nmol/L (ref 16.5–55.9)
TESTOSTERONE FREE: 4.6 pg/mL — AB (ref 6.8–21.5)
TESTOSTERONE: 170 ng/dL — AB (ref 348–1197)

## 2015-05-24 ENCOUNTER — Telehealth: Payer: Self-pay | Admitting: Endocrinology

## 2015-05-24 NOTE — Telephone Encounter (Signed)
PT calling about lab results from the other week.

## 2015-05-24 NOTE — Telephone Encounter (Signed)
The free testosterone level is low and test indicated decreased pituitary gland function.  As discussed would like to have him try clomiphene 50 mg half tablet 2 times a week Needs follow-up in 6 weeks with morning total testosterone, LH, TSH from our lab and IGF-1 from [facility] Corp. level with diagnosis of hypogonadotropic hypogonadism and fatigue

## 2015-05-24 NOTE — Telephone Encounter (Signed)
Please see below and advise.

## 2015-05-25 ENCOUNTER — Other Ambulatory Visit: Payer: Self-pay | Admitting: *Deleted

## 2015-05-25 DIAGNOSIS — R5383 Other fatigue: Secondary | ICD-10-CM

## 2015-05-25 DIAGNOSIS — E230 Hypopituitarism: Secondary | ICD-10-CM

## 2015-05-25 MED ORDER — CLOMIPHENE CITRATE 50 MG PO TABS: 50.0000 mg | ORAL_TABLET | Freq: Every day | ORAL | Status: DC

## 2015-05-25 NOTE — Telephone Encounter (Signed)
Results given to patient, labs ordered rx sent

## 2015-06-28 ENCOUNTER — Other Ambulatory Visit (INDEPENDENT_AMBULATORY_CARE_PROVIDER_SITE_OTHER): Payer: Managed Care, Other (non HMO)

## 2015-06-28 DIAGNOSIS — E230 Hypopituitarism: Secondary | ICD-10-CM | POA: Diagnosis not present

## 2015-06-28 DIAGNOSIS — R5383 Other fatigue: Secondary | ICD-10-CM | POA: Diagnosis not present

## 2015-06-28 LAB — LUTEINIZING HORMONE: LH: 13.36 m[IU]/mL — ABNORMAL HIGH (ref 1.50–9.30)

## 2015-06-28 LAB — TSH: TSH: 3.73 u[IU]/mL (ref 0.35–4.50)

## 2015-06-28 LAB — TESTOSTERONE: TESTOSTERONE: 240.8 ng/dL — AB (ref 300.00–890.00)

## 2015-06-29 LAB — INSULIN-LIKE GROWTH FACTOR: INSULIN LIKE GF 1: 86 ng/mL (ref 67–205)

## 2015-07-05 ENCOUNTER — Ambulatory Visit (INDEPENDENT_AMBULATORY_CARE_PROVIDER_SITE_OTHER): Payer: Managed Care, Other (non HMO) | Admitting: Endocrinology

## 2015-07-05 ENCOUNTER — Encounter: Payer: Self-pay | Admitting: Endocrinology

## 2015-07-05 VITALS — BP 122/87 | HR 109 | Ht 63.75 in | Wt 145.0 lb

## 2015-07-05 DIAGNOSIS — E230 Hypopituitarism: Secondary | ICD-10-CM

## 2015-07-05 MED ORDER — SILDENAFIL CITRATE 20 MG PO TABS: ORAL_TABLET | ORAL | Status: DC

## 2015-07-05 NOTE — Progress Notes (Signed)
Patient ID: Walter Snow, male   DOB: Apr 05, 1965, 50 y.o.   MRN: [id number]          Chief complaint: Decreased libido  Referring physician: Olean Reeeborah Gessner   History of Present Illness  He has had complaints ofdecreased libido and erectile function for 6-12 months Although he does not have excessive fatigue, decreased motivation or depressed mood he thinks he is not as strong as he was with his arms. Initially had difficulties with sustaining erections but now because of his markedly decreased libido he is not having this issue He thinks that he is having less muscle mass and more fat on his body  The patient has a history of using anabolic steroids about 6-7 years ago for a year to help with multiple joint pains and stop taking pain medications.  He has not taken any such supplements for about 6-7 years now  Prior lab results showtestosterone levels of:  Lab Results  Component Value Date   TESTOSTERONE 240.80* 06/28/2015   TESTOSTERONE 170* 05/17/2015   TESTOSTERONE 156* 04/19/2015   TESTOSTERONE 204* 03/29/2015    Prolactin level:Not available  Lab Results  Component Value Date   LH 13.36* 06/28/2015    Since his free testosterone was significantly low along with normal LH and prolactin he was started on a trial of clomiphene half tablet 3 times a week in 5/17  With this he thinks that his muscle strength is somewhat better when he is lifting weights at work He is still having decreased libido and erectile function although this may be slightly better        Medication List       This list is accurate as of: 07/05/15  1:07 PM.  Always use your most recent med list.               atorvastatin 20 MG tablet  Commonly known as:  LIPITOR  Take 1 tablet (20 mg total) by mouth daily.     clomiPHENE 50 MG tablet  Commonly known as:  CLOMID  Take 1 tablet (50 mg total) by mouth daily. Take 1/2 tablet 3 times a week     lisinopril 10 MG tablet  Commonly  known as:  PRINIVIL,ZESTRIL  Take 1 tablet (10 mg total) by mouth daily.     LORazepam 0.5 MG tablet  Commonly known as:  ATIVAN  Take 0.5 mg by mouth every 8 (eight) hours.     metroNIDAZOLE 0.75 % gel  Commonly known as:  METROGEL  Apply 1 application topically 2 (two) times daily.     sildenafil 20 MG tablet  Commonly known as:  REVATIO  3-4 pills as needed as directed        Allergies:  Allergies  Allergen Reactions  . Penicillins Hives    Past Medical History  Diagnosis Date  . Allergy   . Anemia   . Anxiety   . Arthritis   . Hypertension   . ADD (attention deficit disorder)     No past surgical history on file.  Family History  Problem Relation Age of Onset  . Cancer Father   . Heart disease Father   . Diabetes Paternal Grandmother   . Diabetes Paternal Aunt     Social History:  reports that he has been smoking Cigarettes.  He has a 30 pack-year smoking history. He does not have any smokeless tobacco history on file. He reports that he drinks alcohol. He reports that he does not use  illicit drugs.  Review of Systems    General Examination:   BP 122/87 mmHg  Pulse 109  Ht 5' 3.75" (1.619 m)  Wt 145 lb (65.772 kg)  BMI 25.09 kg/m2  SpO2 95%   Assessment/ Plan:   Hypogonadotropic hypogonadism    He has been symptomatic mostly with decreased libido and ED   He has significantly decreased testosterone levels at baseline Currently appears to have slight improvement in his testosterone level He subjectively is doing slightly better also  Since his LH is increasing with the clomiphene would recommend continuing this for at least another 2 months to enable testosterone levels to normalize and avoid long-term testosterone supplements He is agreeable to this plan  For his erectile dysfunction sildenafil 20 mg tablets, use 3-5 as needed will be prescribed  Recommend follow-up with PCP for monitoring blood pressure    Walter Snow 07/05/2015, 1:07  PM

## 2015-07-07 ENCOUNTER — Telehealth: Payer: Self-pay

## 2015-07-07 NOTE — Telephone Encounter (Signed)
Left message to call office for patient. Dr Lucianne MussKumar said he needed to pay out of pocket for the sildenafil. He may want to call around. [facility] in [place] is sometimes one of the cheapest on the sildenafil self pay.

## 2015-08-30 ENCOUNTER — Other Ambulatory Visit: Payer: Managed Care, Other (non HMO)

## 2015-09-04 ENCOUNTER — Ambulatory Visit: Payer: Managed Care, Other (non HMO) | Admitting: Endocrinology

## 2015-12-15 ENCOUNTER — Telehealth: Payer: Self-pay

## 2015-12-15 DIAGNOSIS — I10 Essential (primary) hypertension: Secondary | ICD-10-CM

## 2015-12-15 MED ORDER — LISINOPRIL 10 MG PO TABS: 10.0000 mg | ORAL_TABLET | Freq: Every day | ORAL | 0 refills | Status: DC

## 2015-12-15 NOTE — Telephone Encounter (Signed)
Operator took call from pt and asked me about refill of lisinopril that pharm had told pt they sent a req for. We have not received a req. Had operator tell pt that req most likely went to D [facility]essner's new practice and that I will send in a 30 day RF but pt needs f/up OV for more.

## 2016-01-03 ENCOUNTER — Ambulatory Visit: Payer: Managed Care, Other (non HMO) | Admitting: Family Medicine

## 2016-01-10 ENCOUNTER — Ambulatory Visit: Payer: Managed Care, Other (non HMO) | Admitting: Family Medicine

## 2017-08-11 ENCOUNTER — Ambulatory Visit (HOSPITAL_COMMUNITY)
Admission: EM | Admit: 2017-08-11 | Discharge: 2017-08-11 | Disposition: A | Discharge status: Home / Self Care | Payer: PRIVATE HEALTH INSURANCE | Attending: Family Medicine | Admitting: Family Medicine

## 2017-08-11 ENCOUNTER — Encounter (HOSPITAL_COMMUNITY): Payer: Self-pay | Admitting: Emergency Medicine

## 2017-08-11 DIAGNOSIS — I10 Essential (primary) hypertension: Secondary | ICD-10-CM | POA: Diagnosis not present

## 2017-08-11 DIAGNOSIS — R51 Headache: Secondary | ICD-10-CM | POA: Diagnosis not present

## 2017-08-11 DIAGNOSIS — E785 Hyperlipidemia, unspecified: Secondary | ICD-10-CM

## 2017-08-11 DIAGNOSIS — F17219 Nicotine dependence, cigarettes, with unspecified nicotine-induced disorders: Secondary | ICD-10-CM

## 2017-08-11 DIAGNOSIS — H539 Unspecified visual disturbance: Secondary | ICD-10-CM

## 2017-08-11 MED ORDER — ATORVASTATIN CALCIUM 20 MG PO TABS
20.0000 mg | ORAL_TABLET | Freq: Every day | ORAL | 0 refills | Status: DC
Start: 2017-08-11 — End: 2021-04-30

## 2017-08-11 MED ORDER — LISINOPRIL-HYDROCHLOROTHIAZIDE 20-12.5 MG PO TABS: 1.0000 | ORAL_TABLET | Freq: Every day | ORAL | 0 refills | Status: DC

## 2017-08-11 NOTE — Discharge Instructions (Signed)
I agree with your plan to try to quit smoking I am changing your blood pressure medicine to lisinopril with hydrochlorothiazide for better blood pressure control You need to go back on your atorvastatin.  Start taking 1 every other day for 2 weeks, then go to 1 a day if tolerated You need to follow-up with her primary care doctor  If you have a recurrence of sudden visual disturbance, go to ER

## 2017-08-11 NOTE — ED Provider Notes (Signed)
[facility]    CSN: [id number] Arrival date & time: 08/11/17  1551     History   Chief Complaint Chief Complaint  Patient presents with  . Eye Problem    HPI Walter Snow is a 52 y.o. male.   HPI  Patient with history of atypical migraines for years.  He gets visual symptoms but no headache.  Usually one eye.  No nausea or vomiting.  Worked up by PCP with head scan and labs.  Last night was very angry, complains of stress in the home.  He had sudden onset of visual disturbance last night, felt briefly double vision.  No numbness or weakness or loss of cognition.  Wife was with him.   He has vascular risk factors of tobacco use, hypertension, hyperlipidemia. Heart disease in father.  Today feels better.  Mild headache.  Does not take his BP at home but is complaint with daily lisinopril.  Is not on his statin - quit years ago when he lost health insurance.    Past Medical History:  Diagnosis Date  . ADD (attention deficit disorder)   . Allergy   . Anemia   . Anxiety   . Arthritis   . Hypertension     Patient Active Problem List   Diagnosis Date Noted  . Hypogonadotropic hypogonadism (HCC) 05/15/2015  . HTN (hypertension) 01/21/2014  . Tobacco use disorder 01/21/2014    History reviewed. No pertinent surgical history.     Home Medications    Prior to Admission medications   Medication Sig Start Date End Date Taking? Authorizing Provider  atorvastatin (LIPITOR) 20 MG tablet Take 1 tablet (20 mg total) by mouth daily. 08/11/17   Eustace Moore, MD  lisinopril-hydrochlorothiazide (ZESTORETIC) 20-12.5 MG tablet Take 1 tablet by mouth daily. 08/11/17   Eustace Moore, MD  sildenafil (REVATIO) 20 MG tablet 3-4 pills as needed as directed 07/05/15   Reather Littler, MD    Family History Family History  Problem Relation Age of Onset  . Cancer Father   . Heart disease Father   . Diabetes Paternal Grandmother   . Diabetes Paternal Aunt     Social  History Social History   Tobacco Use  . Smoking status: Current Every Day Smoker    Packs/day: 1.00    Years: 30.00    Pack years: 30.00    Types: Cigarettes  Substance Use Topics  . Alcohol use: Yes    Alcohol/week: 0.0 oz  . Drug use: No     Allergies   Penicillins   Review of Systems Review of Systems  Constitutional: Negative for chills and fever.  HENT: Negative for ear pain and sore throat.   Eyes: Positive for visual disturbance. Negative for pain.  Respiratory: Negative for cough and shortness of breath.   Cardiovascular: Negative for chest pain and palpitations.  Gastrointestinal: Negative for abdominal pain and vomiting.  Genitourinary: Negative for dysuria and hematuria.  Musculoskeletal: Negative for arthralgias and back pain.  Skin: Negative for color change and rash.  Neurological: Positive for headaches. Negative for dizziness, seizures, syncope, facial asymmetry, weakness and numbness.  Psychiatric/Behavioral: Negative for confusion and decreased concentration.  All other systems reviewed and are negative.    Physical Exam Triage Vital Signs ED Triage Vitals  Enc Vitals Group     BP 08/11/17 1640 (!) 150/106     Pulse Rate 08/11/17 1640 (!) 122     Resp 08/11/17 1640 18     Temp 08/11/17  1640 98.7 F (37.1 C)     Temp Source 08/11/17 1640 Temporal     SpO2 08/11/17 1640 97 %     Weight 08/11/17 1642 145 lb (65.8 kg)     Height --      Head Circumference --      Peak Flow --      Pain Score 08/11/17 1642 0     Pain Loc --      Pain Edu? --      Excl. in GC? --    No data found.  Updated Vital Signs BP (!) 150/106   Pulse (!) 122   Temp 98.7 F (37.1 C) (Temporal)   Resp 18   Wt 145 lb (65.8 kg)   SpO2 97%   BMI 25.08 kg/m   Visual Acuity Right Eye Distance: 20/40 Left Eye Distance: 20/50 Bilateral Distance: 20/30      Physical Exam  Constitutional: He appears well-developed and well-nourished. No distress.  HENT:  Head:  Normocephalic and atraumatic.  Right Ear: External ear normal.  Left Ear: External ear normal.  Mouth/Throat: Oropharynx is clear and moist.  Eyes: Pupils are equal, round, and reactive to light. Conjunctivae and EOM are normal.  Discs flat, fundi benign  Neck: Normal range of motion. Neck supple.  No bruit  Cardiovascular: Normal rate, regular rhythm and normal heart sounds.  Pulmonary/Chest: Effort normal and breath sounds normal. No respiratory distress.  Abdominal: Soft. He exhibits no distension.  Musculoskeletal: Normal range of motion. He exhibits no edema.  Neurological: He is alert. He displays normal reflexes. No cranial nerve deficit. Coordination normal.  Skin: Skin is warm and dry.  Psychiatric: He has a normal mood and affect. His behavior is normal.     UC Treatments / Results  Labs (all labs ordered are listed, but only abnormal results are displayed) Labs Reviewed - No data to display  EKG None  Radiology No results found.  Procedures Procedures (including critical care time)  Medications Ordered in UC Medications - No data to display  Initial Impression / Assessment and Plan / UC Course  I have reviewed the triage vital signs and the nursing notes.  Pertinent labs & imaging results that were available during my care of the patient were reviewed by me and considered in my medical decision making (see chart for details).     Discussed with patient and his wife that this could be another atypical migraine with vision disturbance brief.  It also could be a TIA.  We discussed prevention of stroke and TIA with control of blood pressure, reducing cholesterol, regular exercise, baby aspirin, lipid management.  He needs to quit smoking.  If he has another episode, he should go to the emergency department.  He does need to establish with a PCP for additional work-up. Final Clinical Impressions(s) / UC Diagnoses   Final diagnoses:  Visual disturbance    Uncontrolled hypertension  Hyperlipidemia, unspecified hyperlipidemia type  Cigarette nicotine dependence with nicotine-induced disorder     Discharge Instructions     I agree with your plan to try to quit smoking I am changing your blood pressure medicine to lisinopril with hydrochlorothiazide for better blood pressure control You need to go back on your atorvastatin.  Start taking 1 every other day for 2 weeks, then go to 1 a day if tolerated You need to follow-up with her primary care doctor  If you have a recurrence of sudden visual disturbance, go to ER  ED Prescriptions    Medication Sig Dispense Auth. Provider   atorvastatin (LIPITOR) 20 MG tablet Take 1 tablet (20 mg total) by mouth daily. 90 tablet Eustace Moore, MD   lisinopril-hydrochlorothiazide (ZESTORETIC) 20-12.5 MG tablet Take 1 tablet by mouth daily. 90 tablet Eustace Moore, MD     Controlled Substance Prescriptions [place] Controlled Substance Registry consulted? Not Applicable   Eustace Moore, MD 08/11/17 1744

## 2017-08-11 NOTE — ED Triage Notes (Signed)
PT reports he developed double vision last night. Vision is blurry today with a headache. PT has had vision issues with migraines before. PT reports vision change happened before headache. PT was very angry at the time of vision change. PT is tachycardic and hypertensive during triage.

## 2019-10-06 LAB — HM COLONOSCOPY

## 2020-08-09 ENCOUNTER — Encounter (HOSPITAL_COMMUNITY): Payer: Self-pay | Admitting: Emergency Medicine

## 2020-08-09 ENCOUNTER — Emergency Department (HOSPITAL_COMMUNITY)
Admission: EM | Admit: 2020-08-09 | Discharge: 2020-08-09 | Disposition: A | Discharge status: Home / Self Care | Payer: Managed Care, Other (non HMO) | Attending: Emergency Medicine | Admitting: Emergency Medicine

## 2020-08-09 DIAGNOSIS — I10 Essential (primary) hypertension: Secondary | ICD-10-CM | POA: Diagnosis not present

## 2020-08-09 DIAGNOSIS — R000 Tachycardia, unspecified: Secondary | ICD-10-CM | POA: Insufficient documentation

## 2020-08-09 DIAGNOSIS — R21 Rash and other nonspecific skin eruption: Secondary | ICD-10-CM | POA: Diagnosis not present

## 2020-08-09 DIAGNOSIS — F17210 Nicotine dependence, cigarettes, uncomplicated: Secondary | ICD-10-CM | POA: Diagnosis not present

## 2020-08-09 DIAGNOSIS — Z79899 Other long term (current) drug therapy: Secondary | ICD-10-CM | POA: Insufficient documentation

## 2020-08-09 DIAGNOSIS — T7840XA Allergy, unspecified, initial encounter: Secondary | ICD-10-CM

## 2020-08-09 LAB — CBC WITH DIFFERENTIAL/PLATELET
Abs Immature Granulocytes: 0.04 10*3/uL (ref 0.00–0.07)
Basophils Absolute: 0 10*3/uL (ref 0.0–0.1)
Basophils Relative: 0 %
Eosinophils Absolute: 0 10*3/uL (ref 0.0–0.5)
Eosinophils Relative: 0 %
HCT: 47.3 % (ref 39.0–52.0)
Hemoglobin: 16.4 g/dL (ref 13.0–17.0)
Immature Granulocytes: 0 %
Lymphocytes Relative: 14 %
Lymphs Abs: 1.3 10*3/uL (ref 0.7–4.0)
MCH: 31.7 pg (ref 26.0–34.0)
MCHC: 34.7 g/dL (ref 30.0–36.0)
MCV: 91.5 fL (ref 80.0–100.0)
Monocytes Absolute: 0.5 10*3/uL (ref 0.1–1.0)
Monocytes Relative: 5 %
Neutro Abs: 7.9 10*3/uL — ABNORMAL HIGH (ref 1.7–7.7)
Neutrophils Relative %: 81 %
Platelets: 207 10*3/uL (ref 150–400)
RBC: 5.17 MIL/uL (ref 4.22–5.81)
RDW: 18.8 % — ABNORMAL HIGH (ref 11.5–15.5)
WBC: 9.8 10*3/uL (ref 4.0–10.5)
nRBC: 0 % (ref 0.0–0.2)

## 2020-08-09 LAB — BASIC METABOLIC PANEL
Anion gap: 9 (ref 5–15)
BUN: 14 mg/dL (ref 6–20)
CO2: 25 mmol/L (ref 22–32)
Calcium: 9.5 mg/dL (ref 8.9–10.3)
Chloride: 97 mmol/L — ABNORMAL LOW (ref 98–111)
Creatinine, Ser: 1.54 mg/dL — ABNORMAL HIGH (ref 0.61–1.24)
GFR, Estimated: 54 mL/min — ABNORMAL LOW (ref >60)
Glucose, Bld: 101 mg/dL — ABNORMAL HIGH (ref 70–99)
Potassium: 4.5 mmol/L (ref 3.5–5.1)
Sodium: 131 mmol/L — ABNORMAL LOW (ref 135–145)

## 2020-08-09 MED ORDER — PREDNISONE 10 MG PO TABS: 20.0000 mg | ORAL_TABLET | Freq: Every day | ORAL | 0 refills | Status: DC

## 2020-08-09 NOTE — ED Provider Notes (Signed)
[facility] EMERGENCY DEPARTMENT Provider Note   CSN: [id number] Arrival date & time: 08/09/20  4097     History Chief Complaint  Patient presents with   Insect Bite    Walter Snow is a 55 y.o. male.  HPI  Patient presents with rash x2 days.  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  The erythema spread elsewhere to both limbs, lower extremities bilaterally, his abdomen.  It is not pruritic, but he has been having fevers at home as well as night sweats.  He is not diabetic, no recent antibiotic use.  He has tried Tylenol and Benadryl without any relief.  He also reports this morning he woke up with his eyes significantly swollen and almost closed shut.  This has resolved on its own after a few minutes of being awake.  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 No pertinent surgical history.     Family History  Problem Relation Age of Onset   Cancer Father    Heart disease Father    Diabetes Paternal Grandmother    Diabetes Paternal Aunt     Social History   Tobacco Use   Smoking status: Every Day    Packs/day: 1.00    Years: 30.00    Pack years: 30.00    Types: Cigarettes  Substance Use Topics   Alcohol use: Yes    Alcohol/week: 0.0 standard drinks   Drug use: No    Home Medications Prior to Admission medications   Medication Sig Start Date End Date Taking? Authorizing Provider  atorvastatin (LIPITOR) 20 MG tablet Take 1 tablet (20 mg total) by mouth daily. 08/11/17   Eustace Moore, MD  lisinopril-hydrochlorothiazide (ZESTORETIC)  20-12.5 MG tablet Take 1 tablet by mouth daily. 08/11/17   Eustace Moore, MD  sildenafil (REVATIO) 20 MG tablet 3-4 pills as needed as directed 07/05/15   Reather Littler, MD    Allergies    Penicillins  Review of Systems   Review of Systems  Constitutional:  Positive for fever.  HENT:  Positive for facial swelling. Negative for voice change.   Respiratory:  Negative for shortness of breath.   Cardiovascular:  Negative for chest pain.  Skin:  Positive for rash.   Physical Exam Updated Vital Signs BP 124/79   Pulse (!) 102   Temp 98.3 F (36.8 C)   Resp 16   Ht 5\' 4"  (1.626 m)   Wt 70.3 kg   SpO2 99%   BMI 26.61 kg/m   Physical Exam Vitals and nursing note reviewed. Exam conducted with a chaperone present.  Constitutional:      Appearance: Normal appearance.  HENT:     Head: Normocephalic and atraumatic.     Mouth/Throat:     Comments: Airway is patent, uvula is midline Eyes:     General: No scleral icterus.       Right eye: No discharge.        Left eye: No discharge.     Extraocular Movements: Extraocular movements intact.  Pupils: Pupils are equal, round, and reactive to light.  Cardiovascular:     Rate and Rhythm: Regular rhythm. Tachycardia present.     Pulses: Normal pulses.     Heart sounds: Normal heart sounds. No murmur heard.   No friction rub. No gallop.     Comments: Mildly tachycardic, but no murmurs rubs or gallops on auscultation Pulmonary:     Effort: Pulmonary effort is normal. No respiratory distress.     Breath sounds: Normal breath sounds.  Abdominal:     General: Abdomen is flat. Bowel sounds are normal. There is no distension.     Palpations: Abdomen is soft.     Tenderness: There is no abdominal tenderness.  Skin:    General: Skin is warm and dry.     Coloration: Skin is not jaundiced.     Findings: Rash present.     Comments: Patchy erythema is noted to the limbs bilaterally x4.  There is no weeping or any blisters.  no  dermatographia.  Neurological:     Mental Status: He is alert. Mental status is at baseline.     Coordination: Coordination normal.          ED Results / Procedures / Treatments   Labs (all labs ordered are listed, but only abnormal results are displayed) Labs Reviewed  BASIC METABOLIC PANEL - Abnormal; Notable for the following components:      Result Value   Sodium 131 (*)    Chloride 97 (*)    Glucose, Bld 101 (*)    Creatinine, Ser 1.54 (*)    GFR, Estimated 54 (*)    All other components within normal limits  CBC WITH DIFFERENTIAL/PLATELET - Abnormal; Notable for the following components:   RDW 18.8 (*)    Neutro Abs 7.9 (*)    All other components within normal limits    EKG None  Radiology No results found.  Procedures Procedures   Medications Ordered in ED Medications - No data to display  ED Course  I have reviewed the triage vital signs and the nursing notes.  Pertinent labs & imaging results that were available during my care of the patient were reviewed by me and considered in my medical decision making (see chart for details).  Clinical Course as of 08/09/20 1415  Wed Aug 09, 2020  1355 Creatinine(!): 1.54 Mildly elevated compared to most recent labs from 5 years ago.  However BUN is fine, we will have him follow-up with a primary care doctor. [HS]  1355 CBC with Differential(!) No leukocytosis, no anemia [HS]  1355 Basic metabolic panel(!) Patient mildly hyponatremic natremia, but no gross electrolyte derangement. [HS]    Clinical Course User Index [HS] Theron Arista, PA-C   MDM Rules/Calculators/A&P                           Patient is mildly tachycardic to 102, but this is improved from his initial intake.  He is not objectively febrile.  Do not suspect sepsis at this time.  We will try a prednisone course, calamine lotion, Benadryl and see if this improves improves the rash.  Return precautions were discussed with the patient who  verbalized understanding and agreement with the plan.  Patient's vitals are stable, at this point he is appropriate for discharge.  Discussed HPI, physical exam and plan of care for this patient with attending Blane Ohara. The attending physician evaluated this patient as part of a  shared visit and agrees with plan of care.   Final Clinical Impression(s) / ED Diagnoses Final diagnoses:  None    Rx / DC Orders ED Discharge Orders     None        Theron Arista, Cordelia Poche 08/09/20 1420    Blane Ohara, MD 08/10/20 1729

## 2020-08-09 NOTE — ED Provider Notes (Signed)
Emergency Medicine Provider Triage Evaluation Note  Walter Snow , a 55 y.o. male  was evaluated in triage.  Pt complains of rash.  Started on his elbow, he has patchy erythema to both limbs, abdomen, legs bilaterally.  Started Monday, has been spreading since then.  He has had fevers.  No history of the same, no recent antibiotic, no known allergies.    Review of Systems  Positive: Rash, fevers  Negative: Chest pain, SOB  Physical Exam  BP 138/87 (BP Location: Right Arm)   Pulse (!) 124   Temp 98.3 F (36.8 C)   Resp 17   Ht 5\' 4"  (1.626 m)   Wt 70.3 kg   SpO2 99%   BMI 26.61 kg/m  Gen:   Awake, no distress   Resp:  Normal effort  MSK:   Moves extremities without difficulty  Other:  Patchy erythema to limbs bilaterally   Medical Decision Making  Medically screening exam initiated at 9:31 AM.  Appropriate orders placed.  Axiel Fjeld was informed that the remainder of the evaluation will be completed by another provider, this initial triage assessment does not replace that evaluation, and the importance of remaining in the ED until their evaluation is complete.     Susa Day, PA-C 08/09/20 08/11/20    [id number], MD 08/10/20 [phone]

## 2020-08-09 NOTE — Discharge Instructions (Addendum)
Take 20 mg of prednisone in the morning and 20 mg of prednisone nightly for the next 5 days. Continue take Benadryl as needed at night as it can make you drowsy. You can apply calamine lotion as needed to the inflamed areas. If you develop a fever of 102 degrees, if symptoms change, if you have difficulty breathing, shortness of breath, chest pain please return back to the ED for further evaluation.

## 2020-08-09 NOTE — ED Triage Notes (Signed)
Pt reports possible bug bites on Monday. States he started having fevers and rash all over, and woke up with his eyes almost shut. Pt has been taking benadryl and tylenol.

## 2021-04-30 ENCOUNTER — Encounter: Payer: Self-pay | Admitting: Physician Assistant

## 2021-04-30 ENCOUNTER — Ambulatory Visit (INDEPENDENT_AMBULATORY_CARE_PROVIDER_SITE_OTHER): Payer: Managed Care, Other (non HMO) | Admitting: Physician Assistant

## 2021-04-30 VITALS — BP 123/83 | HR 96 | Temp 98.7°F | Ht 64.0 in | Wt 156.0 lb

## 2021-04-30 DIAGNOSIS — F1090 Alcohol use, unspecified, uncomplicated: Secondary | ICD-10-CM

## 2021-04-30 DIAGNOSIS — I471 Supraventricular tachycardia: Secondary | ICD-10-CM | POA: Diagnosis not present

## 2021-04-30 DIAGNOSIS — E785 Hyperlipidemia, unspecified: Secondary | ICD-10-CM

## 2021-04-30 DIAGNOSIS — I10 Essential (primary) hypertension: Secondary | ICD-10-CM

## 2021-04-30 DIAGNOSIS — M25511 Pain in right shoulder: Secondary | ICD-10-CM

## 2021-04-30 DIAGNOSIS — E230 Hypopituitarism: Secondary | ICD-10-CM | POA: Diagnosis not present

## 2021-04-30 DIAGNOSIS — F419 Anxiety disorder, unspecified: Secondary | ICD-10-CM

## 2021-04-30 DIAGNOSIS — M25512 Pain in left shoulder: Secondary | ICD-10-CM

## 2021-04-30 DIAGNOSIS — G8929 Other chronic pain: Secondary | ICD-10-CM

## 2021-04-30 DIAGNOSIS — F17200 Nicotine dependence, unspecified, uncomplicated: Secondary | ICD-10-CM

## 2021-04-30 DIAGNOSIS — Z789 Other specified health status: Secondary | ICD-10-CM

## 2021-04-30 DIAGNOSIS — K219 Gastro-esophageal reflux disease without esophagitis: Secondary | ICD-10-CM

## 2021-04-30 DIAGNOSIS — Z7689 Persons encountering health services in other specified circumstances: Secondary | ICD-10-CM

## 2021-04-30 DIAGNOSIS — I4719 Other supraventricular tachycardia: Secondary | ICD-10-CM

## 2021-04-30 MED ORDER — METHYLPREDNISOLONE 4 MG PO TBPK: ORAL_TABLET | ORAL | 0 refills | Status: DC

## 2021-04-30 MED ORDER — LISINOPRIL 20 MG PO TABS: 20.0000 mg | ORAL_TABLET | Freq: Every day | ORAL | 1 refills | Status: DC

## 2021-04-30 MED ORDER — ATORVASTATIN CALCIUM 20 MG PO TABS: 20.0000 mg | ORAL_TABLET | Freq: Every day | ORAL | 1 refills | Status: DC

## 2021-04-30 NOTE — Patient Instructions (Signed)

## 2021-04-30 NOTE — Progress Notes (Signed)
? ?New Patient Office Visit ? ?Subjective:  ?Patient ID: Walter Snow, male    DOB: [birth year]  Age: 56 y.o. MRN: [id number] ? ?CC:  ?Chief Complaint  ?Patient presents with  ? New Patient (Initial Visit)  ? ? ?HPI ?Walter Snow presents to establish care. Patient has a past medical hx of anxiety, hypertension, hyperlipidemia, arthritis and AV nodal tachycardia. Patient takes Lisinopril 20 mg daily for high blood pressure, metoprolol succinate 25 mg daily for tachycardia, atorvastatin 20 mg for high cholesterol, and omeprazole 20 mg for GERD. Patient goes to [facility] MD for management of testosterone therapy which he has been on for several years. Patient reports having bilateral shoulder pain x 6 months. No stiffness. Rates pain 6/10. States it is painful to raise shoulders above 90 degrees. In the past had a cortisone shot of right shoulder. 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 Patient is a current smoker with 60 pack yr hx. Report drinks about 3 beers daily. Drinks 1 cup of caffeine daily.  ? ?Past Medical History:  ?Diagnosis Date  ? ADD (attention deficit disorder)   ? Allergy   ? Anemia   ? Anxiety   ? Arthritis   ? Hypertension   ? ? ?History reviewed. No pertinent surgical history. ? ?Family History  ?Problem Relation Age of Onset  ? Heart attack Mother   ? Heart attack Father   ? Cancer Father   ?     Kidney  ? Heart disease Father   ? Diabetes Paternal Aunt   ? Diabetes Paternal Grandmother   ? ? ?Social History  ? ?Socioeconomic History  ? Marital status: Married  ?  Spouse name: Walter Snow  ? Number of children: 3  ? Years of education: Not on file  ? Highest education level: Not on file  ?Occupational History  ? Not on file  ?Tobacco Use  ? Smoking status: Every Day  ?  Packs/day: 2.00  ?  Years: 30.00  ?  Pack years: 60.00  ?  Types: Cigarettes  ? Smokeless tobacco: Not on file  ?Substance and Sexual Activity  ? Alcohol use: Yes  ?   Alcohol/week: 0.0 standard drinks  ? Drug use: No  ? Sexual activity: Yes  ?  Birth control/protection: None  ?Other Topics Concern  ? Not on file  ?Social History Narrative  ? Not on file  ? ?Social Determinants of Health  ? ?Financial Resource Strain: Not on file  ?Food Insecurity: Not on file  ?Transportation Needs: Not on file  ?Physical Activity: Not on file  ?Stress: Not on file  ?Social Connections: Not on file  ?Intimate Partner Violence: Not on file  ? ? ?  04/30/2021  ?  3:00 PM 03/08/2015  ? 10:33 AM 09/05/2014  ? 11:29 AM  ?Depression screen PHQ 2/9  ?Decreased Interest 0 0 0  ?Down, Depressed, Hopeless 0 0 0  ?PHQ - 2 Score 0 0 0  ?Altered sleeping 0    ?Tired, decreased energy 0    ?Change in appetite 0    ?Feeling bad or failure about yourself  0    ?Trouble concentrating 0    ?Moving slowly or fidgety/restless 0    ?Suicidal thoughts 0    ?PHQ-9 Score 0    ?Difficult doing work/chores Not difficult at all    ? ? ?  04/30/2021  ?  3:00 PM  ?GAD 7 : Generalized Anxiety  Score  ?Nervous, Anxious, on Edge 0  ?Control/stop worrying 0  ?Worry too much - different things 0  ?Trouble relaxing 0  ?Restless 0  ?Easily annoyed or irritable 0  ?Afraid - awful might happen 0  ?Total GAD 7 Score 0  ?Anxiety Difficulty Not difficult at all  ? ? ? ? ? ?ROS ?Review of Systems ?Review of Systems:  ?A fourteen system review of systems was performed and found to be positive as per HPI. ? ?Objective:  ? ?Today's Vitals: BP 123/83   Pulse 96   Temp 98.7 ?F (37.1 ?C)   Ht 5\' 4"  (1.626 m)   Wt 156 lb (70.8 kg)   SpO2 98%   BMI 26.78 kg/m?  ? ?Physical Exam ?General:  Well Developed, well nourished, appropriate for stated age.  ?Neuro:  Alert and oriented,  extra-ocular muscles intact  ?HEENT:  Normocephalic, atraumatic, neck supple  ?Skin:  no gross rash, warm, pink. ?Cardiac:  RRR, S1 S2 ?Respiratory: CTA B/L  ?MSK: no deformity or step-off appreciated, tenderness of anterior shoulders bilaterally, +empty can test  bilaterally, + Hawkin's test bilaterally, negative drop arm test, limited ROM with flexion/extension/internal and external rotation ?Vascular:  Ext warm, no cyanosis apprec.; cap RF less 2 sec. ?Psych:  No HI/SI, judgement and insight good, Euthymic mood. Full Affect. ? ?Assessment & Plan:  ? ?Problem List Items Addressed This Visit   ? ?  ? Cardiovascular and Mediastinum  ? HTN (hypertension) (Chronic)  ? Relevant Medications  ? metoprolol succinate (TOPROL-XL) 25 MG 24 hr tablet  ? TADALAFIL PO  ? lisinopril (ZESTRIL) 20 MG tablet  ? atorvastatin (LIPITOR) 20 MG tablet  ?  ? Endocrine  ? Hypogonadotropic hypogonadism ([place])  ?  ? Other  ? Tobacco use disorder  ? HLD (hyperlipidemia)  ? Relevant Medications  ? metoprolol succinate (TOPROL-XL) 25 MG 24 hr tablet  ? TADALAFIL PO  ? lisinopril (ZESTRIL) 20 MG tablet  ? atorvastatin (LIPITOR) 20 MG tablet  ? ?Other Visit Diagnoses   ? ? AV nodal tachycardia ([place])    -  Primary  ? Relevant Medications  ? metoprolol succinate (TOPROL-XL) 25 MG 24 hr tablet  ? TADALAFIL PO  ? lisinopril (ZESTRIL) 20 MG tablet  ? atorvastatin (LIPITOR) 20 MG tablet  ? Encounter to establish care      ? Anxiety      ? Chronic pain of both shoulders      ? Relevant Medications  ? methylPREDNISolone (MEDROL DOSEPAK) 4 MG TBPK tablet  ? Gastroesophageal reflux disease, unspecified whether esophagitis present      ? Alcohol use      ? ?  ? ?AV nodal tachycardia: ?-Reviewed cardiology note from 07/21/2018. ?-Patient started on Metoprolol succinate 25 mg for rapid heart rate. ?-Pulse 106, repeated and improved to 96. Advised patient to start checking pulse at home few times per week and keep a log to bring to f/up visit. If pulse is consistently >100 bpm then recommend increasing metoprolol succinate. Discussed reducing alcohol use. Will continue to monitor. ? ?Hypogonadotropic hypogonadism: ?-Followed by Lyn Henri MD. Will request records. ?-On anastrozole 1 mg and testosterone cypionate .75  cc/mls weekly.  ? ?Hypertension: ?-BP today stable. ?-Continue Lisinopril 20 mg. Provided refill. ?-Will continue to monitor and collect CMP for medication monitoring with CPE.  ? ?Hyperlipidemia: ?-Will collect lipid panel/hepatic function with CPE. ?-Continue atorvastatin 20 mg. ?-Will continue to monitor. ? ?Chronic pain of both shoulders: ?-Patient has s/sx of possible  impingement syndrome. Will trial oral steroid taper. Advised if symptoms fail to improve or worsen then recommend referral to Sports Medicine/Orthopedics. Pt verbalized understanding. ? ?GERD: ?-Stable. ?-Continue omeprazole 20 mg. ?-Will continue to monitor. ? ?Anxiety: ?-GAD-7 score of 0.  ?-Will continue to monitor. ? ? ?Outpatient Encounter Medications as of 04/30/2021  ?Medication Sig  ? lisinopril (ZESTRIL) 20 MG tablet Take 1 tablet (20 mg total) by mouth daily.  ? methylPREDNISolone (MEDROL DOSEPAK) 4 MG TBPK tablet Take as directed on package.  ? TADALAFIL PO Take by mouth.  ? atorvastatin (LIPITOR) 20 MG tablet Take 1 tablet (20 mg total) by mouth daily.  ? metoprolol succinate (TOPROL-XL) 25 MG 24 hr tablet Take 25 mg by mouth daily.  ? [DISCONTINUED] atorvastatin (LIPITOR) 20 MG tablet Take 1 tablet (20 mg total) by mouth daily.  ? [DISCONTINUED] lisinopril-hydrochlorothiazide (ZESTORETIC) 20-12.5 MG tablet Take 1 tablet by mouth daily.  ? [DISCONTINUED] predniSONE (DELTASONE) 10 MG tablet Take 2 tablets (20 mg total) by mouth daily.  ? [DISCONTINUED] sildenafil (REVATIO) 20 MG tablet 3-4 pills as needed as directed  ? ?No facility-administered encounter medications on file as of 04/30/2021.  ? ? ?Follow-up: Return for CPE and FBW in 4-6 weeks.  ? ?Lorrene Reid, PA-C ? ?

## 2021-05-28 NOTE — Progress Notes (Signed)
? ?Complete physical exam ? ? ?Patient: Walter Snow   DOB: 30-Oct-1965   56 y.o. Male  MRN: [id number] ?Visit Date: 05/29/2021 ? ? ?Chief Complaint  ?Patient presents with  ? Annual Exam  ? ?Subjective  ?  ?Walter Snow is a 56 y.o. male who presents today for a complete physical exam.  ?He reports consuming a general diet. The patient has a physically strenuous job, but has no regular exercise apart from work.  He generally feels fairly well. He does not have additional problems to discuss today.  ? ? ? ?Past Medical History:  ?Diagnosis Date  ? ADD (attention deficit disorder)   ? Allergy   ? Anemia   ? Anxiety   ? Arthritis   ? Hypertension   ? ?History reviewed. No pertinent surgical history. ?Social History  ? ?Socioeconomic History  ? Marital status: Married  ?  Spouse name: Oskar Cretella  ? Number of children: 3  ? Years of education: Not on file  ? Highest education level: Not on file  ?Occupational History  ? Not on file  ?Tobacco Use  ? Smoking status: Every Day  ?  Packs/day: 2.00  ?  Years: 30.00  ?  Pack years: 60.00  ?  Types: Cigarettes  ? Smokeless tobacco: Not on file  ?Substance and Sexual Activity  ? Alcohol use: Yes  ?  Alcohol/week: 0.0 standard drinks  ? Drug use: No  ? Sexual activity: Yes  ?  Birth control/protection: None  ?Other Topics Concern  ? Not on file  ?Social History Narrative  ? Not on file  ? ?Social Determinants of Health  ? ?Financial Resource Strain: Not on file  ?Food Insecurity: Not on file  ?Transportation Needs: Not on file  ?Physical Activity: Not on file  ?Stress: Not on file  ?Social Connections: Not on file  ?Intimate Partner Violence: Not on file  ? ? ? ?Medications: ?Outpatient Medications Prior to Visit  ?Medication Sig  ? atorvastatin (LIPITOR) 20 MG tablet Take 1 tablet (20 mg total) by mouth daily.  ? lisinopril (ZESTRIL) 20 MG tablet Take 1 tablet (20 mg total) by mouth daily.  ? methylPREDNISolone (MEDROL DOSEPAK) 4 MG TBPK tablet Take as directed on package.  ?  metoprolol succinate (TOPROL-XL) 25 MG 24 hr tablet Take 25 mg by mouth daily.  ? TADALAFIL PO Take by mouth.  ? ?No facility-administered medications prior to visit.  ? ? ?Review of Systems ?Review of Systems:  ?A fourteen system review of systems was performed and found to be positive as per HPI. ? ?Last CBC ?Lab Results  ?Component Value Date  ? WBC 9.8 08/09/2020  ? HGB 16.4 08/09/2020  ? HCT 47.3 08/09/2020  ? MCV 91.5 08/09/2020  ? MCH 31.7 08/09/2020  ? RDW 18.8 (H) 08/09/2020  ? PLT 207 08/09/2020  ? ?Last metabolic panel ?Lab Results  ?Component Value Date  ? GLUCOSE 101 (H) 08/09/2020  ? NA 131 (L) 08/09/2020  ? K 4.5 08/09/2020  ? CL 97 (L) 08/09/2020  ? CO2 25 08/09/2020  ? BUN 14 08/09/2020  ? CREATININE 1.54 (H) 08/09/2020  ? GFRNONAA 54 (L) 08/09/2020  ? CALCIUM 9.5 08/09/2020  ? PROT 7.0 01/26/2014  ? ALBUMIN 4.0 01/26/2014  ? BILITOT 0.6 01/26/2014  ? ALKPHOS 96 01/26/2014  ? AST 24 01/26/2014  ? ALT 17 01/26/2014  ? ANIONGAP 9 08/09/2020  ? ?Last lipids ?Lab Results  ?Component Value Date  ? CHOL 232 (H) 03/29/2015  ?  HDL 39 (L) 03/29/2015  ? LDLCALC 151 (H) 03/29/2015  ? TRIG 209 (H) 03/29/2015  ? CHOLHDL 5.9 (H) 03/29/2015  ? ?Last hemoglobin A1c ?No results found for: HGBA1C ?Last thyroid functions ?Lab Results  ?Component Value Date  ? TSH 3.73 06/28/2015  ? ?Last vitamin D ?No results found for: 25OHVITD2, 25OHVITD3, VD25OH ? Objective  ? ?  ?BP 116/73   Pulse 94   Temp 97.7 ?F (36.5 ?C)   Ht 5\' 4"  (1.626 m)   Wt 156 lb (70.8 kg)   SpO2 100%   BMI 26.78 kg/m?  ?BP Readings from Last 3 Encounters:  ?05/29/21 116/73  ?04/30/21 123/83  ?08/09/20 112/82  ? ?Wt Readings from Last 3 Encounters:  ?05/29/21 156 lb (70.8 kg)  ?04/30/21 156 lb (70.8 kg)  ?08/09/20 155 lb (70.3 kg)  ? ? ?Physical Exam  ? ?General Appearance:     ?  Alert, cooperative, in no acute distress, appears stated age  ?Head:    Normocephalic, without obvious abnormality, atraumatic  ?Eyes:    PERRL, conjunctiva/corneas  clear, EOM's intact, fundi  ?  benign, both eyes       ?Ears:    Normal TM and external ear canal, left ear; cerumen impaction of right ear  ?Nose:   Nares normal, septum midline, mucosa normal, no drainage ?  or sinus tenderness  ?Throat:   Lips, mucosa, and tongue normal; teeth and gums fair  ?Neck:   Supple, symmetrical, trachea midline, no adenopathy;     ?  thyroid:  No enlargement/tenderness/nodules; no JVD  ?Back:     Symmetric, no curvature, ROM normal, no CVA tenderness  ?Lungs:     Clear to auscultation bilaterally, respirations unlabored  ?Chest wall:    No tenderness or deformity  ?Heart:    Normal heart rate. Normal rhythm. No murmurs, rubs, or gallops.  S1 and S2 normal  ?Abdomen:     Soft, non-tender, bowel sounds active all four quadrants,  ?  no masses, no organomegaly  ?Genitalia:    deferred  ?Rectal:    deferred  ?Extremities:   All extremities are intact. No cyanosis or edema  ?Pulses:   2+ and symmetric all extremities  ?Skin:   Skin color, texture, turgor normal, no rashes or lesions  ?Lymph nodes:   Cervical and supraclavicular nodes normal  ?Neurologic:   CNII-XII grossly intact.  ? ? ? ?Last depression screening scores ? ?  05/29/2021  ?  8:13 AM 04/30/2021  ?  3:00 PM 03/08/2015  ? 10:33 AM  ?PHQ 2/9 Scores  ?PHQ - 2 Score 0 0 0  ?PHQ- 9 Score 1 0   ? ?Last fall risk screening ? ?  05/29/2021  ?  8:13 AM  ?Fall Risk   ?Falls in the past year? 0  ?Number falls in past yr: 0  ?Injury with Fall? 0  ?Risk for fall due to : No Fall Risks  ?Follow up Falls evaluation completed  ? ? ? ?No results found for any visits on 05/29/21. ? Assessment & Plan  ?  ?Routine Health Maintenance and Physical Exam ? ?Exercise Activities and Dietary recommendations ?-Discussed heart healthy diet low in fat and carbohydrates.  ? ?Immunization History  ?Administered Date(s) Administered  ? Tdap 08/29/2014  ? ? ?Health Maintenance  ?Topic Date Due  ? COVID-19 Vaccine (1) Never done  ? HIV Screening  Never done  ?  Hepatitis C Screening  Never done  ? COLONOSCOPY (Pts [id number] Insurance  coverage will need to be confirmed)  Never done  ? Zoster Vaccines- Shingrix (1 of 2) Never done  ? INFLUENZA VACCINE  08/14/2021  ? TETANUS/TDAP  08/28/2024  ? HPV VACCINES  Aged Out  ? ? ?Discussed health benefits of physical activity, and encouraged him to engage in regular exercise appropriate for his age and condition. ? ?Problem List Items Addressed This Visit   ? ?  ? Cardiovascular and Mediastinum  ? HTN (hypertension) (Chronic)  ? Relevant Orders  ? CBC  ? Comprehensive metabolic panel  ? TSH  ? Lipid panel  ? Hemoglobin A1c  ?  ? Other  ? HLD (hyperlipidemia)  ? Relevant Orders  ? CBC  ? Comprehensive metabolic panel  ? TSH  ? Lipid panel  ? Hemoglobin A1c  ? ?Other Visit Diagnoses   ? ? Healthcare maintenance    -  Primary  ? Relevant Orders  ? CBC  ? Comprehensive metabolic panel  ? TSH  ? Lipid panel  ? Hemoglobin A1c  ? Alcohol use      ? Relevant Orders  ? CBC  ? Comprehensive metabolic panel  ? TSH  ? Lipid panel  ? Hemoglobin A1c  ? Chronic pain of both shoulders      ? Relevant Orders  ? Ambulatory referral to Sports Medicine  ? Impacted cerumen of right ear      ? ?  ? ?Recommend to schedule lab visit for FBW. ?Pt deferred immunizations, Hep C and HIV screenings. ?BP and heart rate stable, will continue current medication regimen. ?Will place referral to sports medicine for bilateral shoulder pain, pt reports in the past had cortisone injection which worked well. ?Encourage to continue with tobacco reduction and eventually quit. ? ?Return in about 6 months (around 11/29/2021) for HTN, HLD; lab visit 1-5 weeks for FBW.  ?  ? ? ? ?Mayer Masker, PA-C  ?[facility] ?[phone] (phone) ?[phone] (fax) ? ?[facility] ? ?

## 2021-05-29 ENCOUNTER — Encounter: Payer: Self-pay | Admitting: Physician Assistant

## 2021-05-29 ENCOUNTER — Ambulatory Visit (INDEPENDENT_AMBULATORY_CARE_PROVIDER_SITE_OTHER): Payer: Managed Care, Other (non HMO) | Admitting: Physician Assistant

## 2021-05-29 VITALS — BP 116/73 | HR 94 | Temp 97.7°F | Ht 64.0 in | Wt 156.0 lb

## 2021-05-29 DIAGNOSIS — M25511 Pain in right shoulder: Secondary | ICD-10-CM

## 2021-05-29 DIAGNOSIS — E785 Hyperlipidemia, unspecified: Secondary | ICD-10-CM | POA: Diagnosis not present

## 2021-05-29 DIAGNOSIS — Z789 Other specified health status: Secondary | ICD-10-CM

## 2021-05-29 DIAGNOSIS — I10 Essential (primary) hypertension: Secondary | ICD-10-CM | POA: Diagnosis not present

## 2021-05-29 DIAGNOSIS — Z0000 Encounter for general adult medical examination without abnormal findings: Secondary | ICD-10-CM | POA: Diagnosis not present

## 2021-05-29 DIAGNOSIS — M25512 Pain in left shoulder: Secondary | ICD-10-CM

## 2021-05-29 DIAGNOSIS — F1090 Alcohol use, unspecified, uncomplicated: Secondary | ICD-10-CM

## 2021-05-29 DIAGNOSIS — G8929 Other chronic pain: Secondary | ICD-10-CM

## 2021-05-29 DIAGNOSIS — H6121 Impacted cerumen, right ear: Secondary | ICD-10-CM

## 2021-05-29 NOTE — Patient Instructions (Signed)

## 2021-06-04 ENCOUNTER — Other Ambulatory Visit: Payer: Self-pay | Admitting: Physician Assistant

## 2021-06-04 DIAGNOSIS — I10 Essential (primary) hypertension: Secondary | ICD-10-CM

## 2021-06-04 DIAGNOSIS — Z789 Other specified health status: Secondary | ICD-10-CM

## 2021-06-04 DIAGNOSIS — I4719 Other supraventricular tachycardia: Secondary | ICD-10-CM

## 2021-06-04 DIAGNOSIS — I471 Supraventricular tachycardia: Secondary | ICD-10-CM

## 2021-06-04 DIAGNOSIS — E230 Hypopituitarism: Secondary | ICD-10-CM

## 2021-06-04 DIAGNOSIS — E785 Hyperlipidemia, unspecified: Secondary | ICD-10-CM

## 2021-06-04 DIAGNOSIS — F1090 Alcohol use, unspecified, uncomplicated: Secondary | ICD-10-CM

## 2021-06-06 ENCOUNTER — Other Ambulatory Visit: Payer: Managed Care, Other (non HMO)

## 2021-06-06 DIAGNOSIS — I10 Essential (primary) hypertension: Secondary | ICD-10-CM

## 2021-06-06 DIAGNOSIS — Z0000 Encounter for general adult medical examination without abnormal findings: Secondary | ICD-10-CM

## 2021-06-06 DIAGNOSIS — Z789 Other specified health status: Secondary | ICD-10-CM

## 2021-06-06 DIAGNOSIS — E785 Hyperlipidemia, unspecified: Secondary | ICD-10-CM

## 2021-06-07 ENCOUNTER — Other Ambulatory Visit: Payer: Self-pay | Admitting: Physician Assistant

## 2021-06-07 ENCOUNTER — Ambulatory Visit (INDEPENDENT_AMBULATORY_CARE_PROVIDER_SITE_OTHER): Payer: Managed Care, Other (non HMO)

## 2021-06-07 ENCOUNTER — Ambulatory Visit: Payer: Self-pay

## 2021-06-07 ENCOUNTER — Ambulatory Visit (INDEPENDENT_AMBULATORY_CARE_PROVIDER_SITE_OTHER): Payer: Managed Care, Other (non HMO) | Admitting: Family Medicine

## 2021-06-07 VITALS — BP 172/94 | HR 104 | Ht 64.0 in | Wt 158.6 lb

## 2021-06-07 DIAGNOSIS — M25511 Pain in right shoulder: Secondary | ICD-10-CM

## 2021-06-07 DIAGNOSIS — D649 Anemia, unspecified: Secondary | ICD-10-CM

## 2021-06-07 DIAGNOSIS — I10 Essential (primary) hypertension: Secondary | ICD-10-CM

## 2021-06-07 DIAGNOSIS — G8929 Other chronic pain: Secondary | ICD-10-CM

## 2021-06-07 DIAGNOSIS — E875 Hyperkalemia: Secondary | ICD-10-CM

## 2021-06-07 DIAGNOSIS — M25512 Pain in left shoulder: Secondary | ICD-10-CM

## 2021-06-07 LAB — CBC
Hematocrit: 40.2 % (ref 37.5–51.0)
Hemoglobin: 12.4 g/dL — ABNORMAL LOW (ref 13.0–17.7)
MCH: 23.4 pg — ABNORMAL LOW (ref 26.6–33.0)
MCHC: 30.8 g/dL — ABNORMAL LOW (ref 31.5–35.7)
MCV: 76 fL — ABNORMAL LOW (ref 79–97)
Platelets: 314 10*3/uL (ref 150–450)
RBC: 5.31 x10E6/uL (ref 4.14–5.80)
RDW: 19.7 % — ABNORMAL HIGH (ref 11.6–15.4)
WBC: 9 10*3/uL (ref 3.4–10.8)

## 2021-06-07 LAB — LIPID PANEL
Chol/HDL Ratio: 5.5 ratio — ABNORMAL HIGH (ref 0.0–5.0)
Cholesterol, Total: 142 mg/dL (ref 100–199)
HDL: 26 mg/dL — ABNORMAL LOW (ref >39)
LDL Chol Calc (NIH): 65 mg/dL (ref 0–99)
Triglycerides: 324 mg/dL — ABNORMAL HIGH (ref 0–149)
VLDL Cholesterol Cal: 51 mg/dL — ABNORMAL HIGH (ref 5–40)

## 2021-06-07 LAB — COMPREHENSIVE METABOLIC PANEL
ALT: 21 IU/L (ref 0–44)
AST: 26 IU/L (ref 0–40)
Albumin/Globulin Ratio: 1.6 (ref 1.2–2.2)
Albumin: 4.5 g/dL (ref 3.8–4.9)
Alkaline Phosphatase: 126 IU/L — ABNORMAL HIGH (ref 44–121)
BUN/Creatinine Ratio: 11 (ref 9–20)
BUN: 15 mg/dL (ref 6–24)
Bilirubin Total: 0.6 mg/dL (ref 0.0–1.2)
CO2: 22 mmol/L (ref 20–29)
Calcium: 9.4 mg/dL (ref 8.7–10.2)
Chloride: 99 mmol/L (ref 96–106)
Creatinine, Ser: 1.39 mg/dL — ABNORMAL HIGH (ref 0.76–1.27)
Globulin, Total: 2.8 g/dL (ref 1.5–4.5)
Glucose: 87 mg/dL (ref 70–99)
Potassium: 5.4 mmol/L — ABNORMAL HIGH (ref 3.5–5.2)
Sodium: 137 mmol/L (ref 134–144)
Total Protein: 7.3 g/dL (ref 6.0–8.5)
eGFR: 61 mL/min/{1.73_m2} (ref >59)

## 2021-06-07 LAB — HEMOGLOBIN A1C
Est. average glucose Bld gHb Est-mCnc: 114 mg/dL
Hgb A1c MFr Bld: 5.6 % (ref 4.8–5.6)

## 2021-06-07 LAB — TSH: TSH: 3.62 u[IU]/mL (ref 0.450–4.500)

## 2021-06-07 NOTE — Patient Instructions (Addendum)
Thank you for coming in today.   Please get an Xray today before you leave   You received steroid injections today. Seek immediate medical attention if the joint becomes red, extremely painful, or is oozing fluid.   I've referred you to Physical Therapy.  Let us know if you don't hear from them in one week.   Recheck in 8 weeks

## 2021-06-07 NOTE — Progress Notes (Signed)
I, Walter Snow, LAT, ATC acting as a scribe for Walter Leader, MD.  Subjective:    CC: Bilateral shoulder pain  HPI: Pt is a 56 y/o male c/o bilat shoulder pain, chronic in nature, worsening over the last few month. Pt reports having steroid injection in his R shoulder 15 years ago. Pt locates pain to all over both GH joints. Pt works in Landscape architect.  Right shoulder is worse than left.  Neck pain: yes- separate issue ("pinched nerve") Radiates: no UE numbness/tingling: no UE weakness: no Aggravates: shrugging shoulders when trying to carry tables for work, any motion about 90 Treatments tried: IBU,   Pertinent review of Systems: No fevers or chills  Relevant historical information: Hypertension.  Recently found to have anemia currently being worked up.   Objective:    Vitals:   06/07/21 1542  BP: (!) 172/94  Pulse: (!) 104  SpO2: 94%   General: Well Developed, well nourished, and in no acute distress.   MSK: Right shoulder: Normal-appearing Nontender to palpation. Range of motion abduction 130 degrees.  Internal rotation lumbar spine external rotation full. Strength 4+/5 abduction 5/5 external and internal rotation Positive Hawkins and Neer's test. Negative Yergason's and speeds test.  Left shoulder: Normally appearing. Nontender to palpation. Normal range of motion. Intact strength. Mildly positive Hawkins and Neer's test. Negative Yergason's and speeds test.  Lab and Radiology Results  Procedure: Real-time Ultrasound Guided Injection of right shoulder subacromial bursa Device: Philips Affiniti 50G Images permanently stored and available for review in PACS Ultrasound evaluation prior to injection reveals hypoechoic fluid surrounding the biceps tendon.  Rotator cuff tendons appear to be intact without visible tear.  Moderate subacromial bursitis is present. Verbal informed consent obtained.  Discussed risks and benefits of  procedure. Warned about infection, bleeding, hyperglycemia damage to structures among others. Patient expresses understanding and agreement Time-out conducted.   Noted no overlying erythema, induration, or other signs of local infection.   Skin prepped in a sterile fashion.   Local anesthesia: Topical Ethyl chloride.   With sterile technique and under real time ultrasound guidance: 40 mg of Kenalog and 2 mL of Marcaine injected into the subacromial bursa. Fluid seen entering the bursa.   Completed without difficulty   Pain immediately resolved suggesting accurate placement of the medication.   Advised to call if fevers/chills, erythema, induration, drainage, or persistent bleeding.   Images permanently stored and available for review in the ultrasound unit.  Impression: Technically successful ultrasound guided injection.    Procedure: Real-time Ultrasound Guided Injection of left shoulder subacromial bursa Device: Philips Affiniti 50G Images permanently stored and available for review in PACS Ultrasound evaluation prior to injection reveals moderate subacromial bursitis Verbal informed consent obtained.  Discussed risks and benefits of procedure. Warned about infection, bleeding, hyperglycemia damage to structures among others. Patient expresses understanding and agreement Time-out conducted.   Noted no overlying erythema, induration, or other signs of local infection.   Skin prepped in a sterile fashion.   Local anesthesia: Topical Ethyl chloride.   With sterile technique and under real time ultrasound guidance: 40 mg of Kenalog and 2 mL of Marcaine injected into subacromial bursa. Fluid seen entering the bursa.   Completed without difficulty   Pain immediately resolved suggesting accurate placement of the medication.   Advised to call if fevers/chills, erythema, induration, drainage, or persistent bleeding.   Images permanently stored and available for review in the ultrasound unit.   Impression: Technically successful ultrasound guided  injection.    X-ray images bilateral shoulders obtained today personally and independently interpreted  Right shoulder: Mild degenerative changes AC joint and glenohumeral joint.  No acute fractures are present.  Left shoulder: Normal-appearing x-ray.  No acute fractures.  Await formal radiology review   Impression and Recommendations:    Assessment and Plan: 56 y.o. male with bilateral shoulder pain right worse than left.  Pain predominantly thought to be due to subacromial impingement and bursitis and rotator cuff tendinitis.  Patient is a good candidate for physical therapy.  Plan for bilateral subacromial injection and physical therapy trial.  Recheck back in about 8 weeks.Marland Kitchen  PDMP not reviewed this encounter. Orders Placed This Encounter  Procedures   [place] LIMITED JOINT SPACE STRUCTURES UP BILAT(NO LINKED CHARGES)    Order Specific Question:   Reason for Exam (SYMPTOM  OR DIAGNOSIS REQUIRED)    Answer:   bilateral shoulder pain    Order Specific Question:   Preferred imaging location?    Answer:   [facility]   DG Shoulder Left    Standing Status:   Future    Number of Occurrences:   1    Standing Expiration Date:   07/08/2021    Order Specific Question:   Reason for Exam (SYMPTOM  OR DIAGNOSIS REQUIRED)    Answer:   bilateral shoulder pain    Order Specific Question:   Preferred imaging location?    Answer:   [facility]   DG Shoulder Right    Standing Status:   Future    Number of Occurrences:   1    Standing Expiration Date:   06/08/2022    Order Specific Question:   Reason for Exam (SYMPTOM  OR DIAGNOSIS REQUIRED)    Answer:   bilateral shoulder pain    Order Specific Question:   Preferred imaging location?    Answer:   [facility]   Ambulatory referral to Physical Therapy    Referral Priority:   Routine    Referral Type:   Physical Medicine    Referral Reason:    Specialty Services Required    Requested Specialty:   Physical Therapy    Number of Visits Requested:   1   No orders of the defined types were placed in this encounter.   Discussed warning signs or symptoms. Please see discharge instructions. Patient expresses understanding.   The above documentation has been reviewed and is accurate and complete Walter Snow, M.D.

## 2021-06-12 NOTE — Progress Notes (Signed)
Left shoulder x-ray looks normal to radiology

## 2021-06-12 NOTE — Progress Notes (Signed)
Right shoulder x-ray shows evidence of old rib fracture.  The shoulder itself looks okay.

## 2021-06-15 ENCOUNTER — Other Ambulatory Visit: Payer: Managed Care, Other (non HMO)

## 2021-06-18 ENCOUNTER — Other Ambulatory Visit: Payer: Managed Care, Other (non HMO)

## 2021-06-18 DIAGNOSIS — I10 Essential (primary) hypertension: Secondary | ICD-10-CM

## 2021-06-18 DIAGNOSIS — D649 Anemia, unspecified: Secondary | ICD-10-CM

## 2021-06-18 DIAGNOSIS — Z0000 Encounter for general adult medical examination without abnormal findings: Secondary | ICD-10-CM

## 2021-06-18 DIAGNOSIS — E785 Hyperlipidemia, unspecified: Secondary | ICD-10-CM

## 2021-06-18 DIAGNOSIS — E875 Hyperkalemia: Secondary | ICD-10-CM

## 2021-06-19 LAB — CBC
Hematocrit: 42.1 % (ref 37.5–51.0)
Hemoglobin: 13.4 g/dL (ref 13.0–17.7)
MCH: 25 pg — ABNORMAL LOW (ref 26.6–33.0)
MCHC: 31.8 g/dL (ref 31.5–35.7)
MCV: 79 fL (ref 79–97)
Platelets: 270 10*3/uL (ref 150–450)
RBC: 5.35 x10E6/uL (ref 4.14–5.80)
RDW: 23.8 % — ABNORMAL HIGH (ref 11.6–15.4)
WBC: 11.1 10*3/uL — ABNORMAL HIGH (ref 3.4–10.8)

## 2021-06-19 LAB — COMPREHENSIVE METABOLIC PANEL
ALT: 33 IU/L (ref 0–44)
AST: 22 IU/L (ref 0–40)
Albumin/Globulin Ratio: 1.8 (ref 1.2–2.2)
Albumin: 4.3 g/dL (ref 3.8–4.9)
Alkaline Phosphatase: 87 IU/L (ref 44–121)
BUN/Creatinine Ratio: 15 (ref 9–20)
BUN: 17 mg/dL (ref 6–24)
Bilirubin Total: 0.6 mg/dL (ref 0.0–1.2)
CO2: 21 mmol/L (ref 20–29)
Calcium: 9.5 mg/dL (ref 8.7–10.2)
Chloride: 97 mmol/L (ref 96–106)
Creatinine, Ser: 1.1 mg/dL (ref 0.76–1.27)
Globulin, Total: 2.4 g/dL (ref 1.5–4.5)
Glucose: 96 mg/dL (ref 70–99)
Potassium: 4.8 mmol/L (ref 3.5–5.2)
Sodium: 134 mmol/L (ref 134–144)
Total Protein: 6.7 g/dL (ref 6.0–8.5)
eGFR: 80 mL/min/{1.73_m2} (ref >59)

## 2021-06-19 LAB — RETICULOCYTES: Retic Ct Pct: 2.2 % (ref 0.6–2.6)

## 2021-06-19 LAB — IRON,TIBC AND FERRITIN PANEL
Ferritin: 50 ng/mL (ref 30–400)
Iron Saturation: 21 % (ref 15–55)
Iron: 84 ug/dL (ref 38–169)
Total Iron Binding Capacity: 405 ug/dL (ref 250–450)
UIBC: 321 ug/dL (ref 111–343)

## 2021-06-19 LAB — TRANSFERRIN: Transferrin: 339 mg/dL — ABNORMAL HIGH (ref 177–329)

## 2021-06-19 LAB — B12 AND FOLATE PANEL
Folate: 18.5 ng/mL (ref >3.0)
Vitamin B-12: 292 pg/mL (ref 232–1245)

## 2021-06-21 NOTE — Patient Instructions (Incomplete)
Iron-Rich Diet  Iron is a mineral that helps your body produce hemoglobin. Hemoglobin is a protein in red blood cells that carries oxygen to your body's tissues. Eating too little iron may cause you to feel weak and tired, and it can increase your risk of infection. Iron is naturally found in many foods, and many foods have iron added to them (are iron-fortified). You may need to follow an iron-rich diet if you do not have enough iron in your body due to certain medical conditions. The amount of iron that you need each day depends on your age, your sex, and any medical conditions you have. Follow instructions from your health care provider or a dietitian about how much iron you should eat each day. What are tips for following this plan? Reading food labels Check food labels to see how many milligrams (mg) of iron are in each serving. Cooking Cook foods in pots and pans that are made from iron. Take these steps to make it easier for your body to absorb iron from certain foods: Soak beans overnight before cooking. Soak whole grains overnight and drain them before using. Ferment flours before baking, such as by using yeast in bread dough. Meal planning When you eat foods that contain iron, you should eat them with foods that are high in vitamin C. These include oranges, peppers, tomatoes, potatoes, and mangoes. Vitamin C helps your body absorb iron. Certain foods and drinks prevent your body from absorbing iron properly. Avoid eating these foods in the same meal as iron-rich foods or with iron supplements. These foods include: Coffee, black tea, and red wine. Milk, dairy products, and foods that are high in calcium. Beans and soybeans. Whole grains. General information Take iron supplements only as told by your health care provider. An overdose of iron can be life-threatening. If you were prescribed iron supplements, take them with orange juice or a vitamin C supplement. When you eat  iron-fortified foods or take an iron supplement, you should also eat foods that naturally contain iron, such as meat, poultry, and fish. Eating naturally iron-rich foods helps your body absorb the iron that is added to other foods or contained in a supplement. Iron from animal sources is better absorbed than iron from plant sources. What foods should I eat? Fruits Prunes. Raisins. Eat fruits high in vitamin C, such as oranges, grapefruits, and strawberries, with iron-rich foods. Vegetables Spinach (cooked). Green peas. Broccoli. Fermented vegetables. Eat vegetables high in vitamin C, such as leafy greens, potatoes, bell peppers, and tomatoes, with iron-rich foods. Grains Iron-fortified breakfast cereal. Iron-fortified whole-wheat bread. Enriched rice. Sprouted grains. Meats and other proteins Beef liver. Beef. [place]. Chicken. Oysters. Shrimp. [place]. Sardines. Chickpeas. Nuts. Tofu. Pumpkin seeds. Beverages Tomato juice. Fresh orange juice. Prune juice. Hibiscus tea. Iron-fortified instant breakfast shakes. Sweets and desserts Blackstrap molasses. Seasonings and condiments Tahini. Fermented soy sauce. Other foods Wheat germ. The items listed above may not be a complete list of recommended foods and beverages. Contact a dietitian for more information. What foods should I limit? These are foods that should be limited while eating iron-rich foods as they can reduce the absorption of iron in your body. Grains Whole grains. Bran cereal. Bran flour. Meats and other proteins Soybeans. Products made from soy protein. Black beans. Lentils. Mung beans. Split peas. Dairy Milk. Cream. Cheese. Yogurt. Cottage cheese. Beverages Coffee. Black tea. Red wine. Sweets and desserts Cocoa. Chocolate. Ice cream. Seasonings and condiments Basil. Oregano. Large amounts of parsley. The items listed  above may not be a complete list of foods and beverages you should limit. Contact a dietitian for more  information. Summary Iron is a mineral that helps your body produce hemoglobin. Hemoglobin is a protein in red blood cells that carries oxygen to your body's tissues. Iron is naturally found in many foods, and many foods have iron added to them (are iron-fortified). When you eat foods that contain iron, you should eat them with foods that are high in vitamin C. Vitamin C helps your body absorb iron. Certain foods and drinks prevent your body from absorbing iron properly, such as whole grains and dairy products. You should avoid eating these foods in the same meal as iron-rich foods or with iron supplements. This information is not intended to replace advice given to you by your health care provider. Make sure you discuss any questions you have with your health care provider. Document Revised: 12/13/2019 Document Reviewed: 12/13/2019 Elsevier Patient Education  [place].  Iron Deficiency Anemia, Adult Iron deficiency anemia is a condition in which the concentration of red blood cells or hemoglobin in the blood is below normal because of too little iron. Hemoglobin is a substance in red blood cells that carries oxygen to the body's tissues. When the concentration of red blood cells or hemoglobin is too low, not enough oxygen reaches these tissues. Iron deficiency anemia is usually long-lasting, and it develops over time. It may or may not cause symptoms. It is a common type of anemia. What are the causes? This condition may be caused by: Not enough iron in the diet. Abnormal absorption in the gut. Increased need for iron because of pregnancy or heavy menstrual periods, for females. Cancers of the gastrointestinal system, such as colon cancer. Blood loss caused by bleeding in the intestine. This may be from a gastrointestinal condition like Crohn's disease. Frequent blood draws, such as from blood donation. What increases the risk? The following factors may make you more likely to  develop this condition: Being pregnant. Being a teenage girl going through a growth spurt. What are the signs or symptoms? Symptoms of this condition may include: Pale skin, lips, and nail beds. Weakness, dizziness, and getting tired easily. Headache. Shortness of breath when moving or exercising. Cold hands and feet. Fast or irregular heartbeat. Irritability or rapid breathing. These are more common in severe anemia. Mild anemia may not cause any symptoms. How is this diagnosed? This condition is diagnosed based on: Your medical history. A physical exam. Blood tests. You may have additional tests to find the underlying cause of your anemia, such as: Testing for blood in the stool (fecal occult blood test). A procedure to see inside your colon and rectum (colonoscopy). A procedure to see inside your esophagus and stomach (endoscopy). A test in which cells are removed from bone marrow (bone marrow aspiration) or fluid is removed from the bone marrow to be examined. This is rarely needed. How is this treated? This condition is treated by correcting the cause of your iron deficiency. Treatment may involve: Adding iron-rich foods to your diet. Taking iron supplements. If you are pregnant or breastfeeding, you may need to take extra iron because your normal diet usually does not provide the amount of iron that you need. Increasing vitamin C intake. Vitamin C helps your body absorb iron. Your health care provider may recommend that you take iron supplements along with a glass of orange juice or a vitamin C supplement. Medicines to make heavy menstrual flow lighter.  Surgery. You may need repeat blood tests to determine whether treatment is working. If the treatment does not seem to be working, you may need more tests. Follow these instructions at home: Medicines Take over-the-counter and prescription medicines only as told by your health care provider. This includes iron supplements and  vitamins. For the best iron absorption, you should take iron supplements when your stomach is empty. If you cannot tolerate them on an empty stomach, you may need to take them with food. Do not drink milk or take antacids at the same time as your iron supplements. Milk and antacids may interfere with iron absorption. Iron supplements may turn stool (feces) a darker color and it may appear black. If you cannot tolerate taking iron supplements by mouth, talk with your health care provider about taking them through an IV or through an injection into a muscle. Eating and drinking  Talk with your health care provider before changing your diet. He or she may recommend that you eat foods that contain a lot of iron, such as: Liver. Low-fat (lean) beef. Breads and cereals that have iron added to them (are fortified). Eggs. Dried fruit. Dark green, leafy vegetables. To help your body use the iron from iron-rich foods, eat those foods at the same time as fresh fruits and vegetables that are high in vitamin C. Foods that are high in vitamin C include: Oranges. Peppers. Tomatoes. Mangoes. Drink enough fluid to keep your urine pale yellow. Managing constipation If you are taking an iron supplement, it may cause constipation. To prevent or treat constipation, you may need to: Take over-the-counter or prescription medicines. Eat foods that are high in fiber, such as beans, whole grains, and fresh fruits and vegetables. Limit foods that are high in fat and processed sugars, such as fried or sweet foods. General instructions Return to your normal activities as told by your health care provider. Ask your health care provider what activities are safe for you. Practice good hygiene. Anemia can make you more prone to illness and infection. Keep all follow-up visits as told by your health care provider. This is important. Contact a health care provider if you: Feel nauseous or you vomit. Feel weak. Have  unexplained sweating. Develop symptoms of constipation, such as: Having fewer than three bowel movements a week. Straining to have a bowel movement. Having stools that are hard, dry, or larger than normal. Feeling full or bloated. Pain in the lower abdomen. Not feeling relief after having a bowel movement. Get help right away if you: Faint. If this happens, do not drive yourself to the hospital. Have chest pain. Have shortness of breath that: Is severe. Gets worse with physical activity. Have an irregular or rapid heartbeat. Become light-headed when getting up from a sitting or lying down position. These symptoms may represent a serious problem that is an emergency. Do not wait to see if the symptoms will go away. Get medical help right away. Call your local emergency services (911 in the [place] Do not drive yourself to the hospital. Summary Iron deficiency anemia is a condition in which the concentration of red blood cells or hemoglobin in the blood is below normal because of too little iron. This condition is treated by correcting the cause of your iron deficiency. Take over-the-counter and prescription medicines only as told by your health care provider. This includes iron supplements and vitamins. To help your body use the iron from iron-rich foods, eat those foods at the same time as fresh  fruits and vegetables that are high in vitamin C. Get help right away if you have shortness of breath that gets worse with physical activity. This information is not intended to replace advice given to you by your health care provider. Make sure you discuss any questions you have with your health care provider. Document Revised: 09/08/2018 Document Reviewed: 09/08/2018 Elsevier Patient Education  [place].

## 2021-06-25 ENCOUNTER — Ambulatory Visit (INDEPENDENT_AMBULATORY_CARE_PROVIDER_SITE_OTHER): Payer: Managed Care, Other (non HMO) | Admitting: Physician Assistant

## 2021-06-25 ENCOUNTER — Encounter: Payer: Self-pay | Admitting: Physician Assistant

## 2021-06-25 VITALS — BP 115/72 | HR 99 | Temp 97.7°F | Ht 64.0 in | Wt 156.0 lb

## 2021-06-25 DIAGNOSIS — E785 Hyperlipidemia, unspecified: Secondary | ICD-10-CM

## 2021-06-25 DIAGNOSIS — D649 Anemia, unspecified: Secondary | ICD-10-CM | POA: Diagnosis not present

## 2021-06-25 DIAGNOSIS — E875 Hyperkalemia: Secondary | ICD-10-CM

## 2021-06-25 NOTE — Progress Notes (Signed)
Established patient visit   Patient: Witt Plitt   DOB: Jun 05, 1965   56 y.o. Male  MRN: [id number] Visit Date: 06/25/2021  No chief complaint on file.  Subjective    HPI  Patient presents to discuss lab results. Patient reports started iron supplement and B complex vitamins a few weeks ago. Also has reduced alcohol use from 3 beers/day to 2/day. States in the past has donated blood and the last time he did he did not feel well, felt tired.      Medications: Outpatient Medications Prior to Visit  Medication Sig   atorvastatin (LIPITOR) 20 MG tablet Take 1 tablet (20 mg total) by mouth daily.   lisinopril (ZESTRIL) 20 MG tablet Take 1 tablet (20 mg total) by mouth daily.   metoprolol succinate (TOPROL-XL) 25 MG 24 hr tablet Take 25 mg by mouth daily.   TADALAFIL PO Take by mouth.   No facility-administered medications prior to visit.    Review of Systems Review of Systems:  A fourteen system review of systems was performed and found to be positive as per HPI.  Last CBC Lab Results  Component Value Date   WBC 11.1 (H) 06/18/2021   HGB 13.4 06/18/2021   HCT 42.1 06/18/2021   MCV 79 06/18/2021   MCH 25.0 (L) 06/18/2021   RDW 23.8 (H) 06/18/2021   PLT 270 80/99/8338   Last metabolic panel Lab Results  Component Value Date   GLUCOSE 96 06/18/2021   NA 134 06/18/2021   K 4.8 06/18/2021   CL 97 06/18/2021   CO2 21 06/18/2021   BUN 17 06/18/2021   CREATININE 1.10 06/18/2021   EGFR 80 06/18/2021   CALCIUM 9.5 06/18/2021   PROT 6.7 06/18/2021   ALBUMIN 4.3 06/18/2021   LABGLOB 2.4 06/18/2021   AGRATIO 1.8 06/18/2021   BILITOT 0.6 06/18/2021   ALKPHOS 87 06/18/2021   AST 22 06/18/2021   ALT 33 06/18/2021   ANIONGAP 9 08/09/2020   Last lipids Lab Results  Component Value Date   CHOL 142 06/06/2021   HDL 26 (L) 06/06/2021   LDLCALC 65 06/06/2021   TRIG 324 (H) 06/06/2021   CHOLHDL 5.5 (H) 06/06/2021   Last hemoglobin A1c Lab Results  Component Value Date    HGBA1C 5.6 06/06/2021   Last thyroid functions Lab Results  Component Value Date   TSH 3.620 06/06/2021   Last vitamin D No results found for: "25OHVITD2", "25OHVITD3", "VD25OH" Last vitamin B12 and Folate Lab Results  Component Value Date   VITAMINB12 292 06/18/2021   FOLATE 18.5 06/18/2021     Objective    BP 115/72   Pulse 99   Temp 97.7 F (36.5 C)   Ht 5' 4" (1.626 m)   Wt 156 lb (70.8 kg)   SpO2 100%   BMI 26.78 kg/m  BP Readings from Last 3 Encounters:  06/25/21 115/72  06/07/21 (!) 172/94  05/29/21 116/73   Wt Readings from Last 3 Encounters:  06/25/21 156 lb (70.8 kg)  06/07/21 158 lb 9.6 oz (71.9 kg)  05/29/21 156 lb (70.8 kg)    Physical Exam  General:  Pleasant and cooperative, appropriate for stated age.  Neuro:  Alert and oriented,  extra-ocular muscles intact  HEENT:  Normocephalic, atraumatic, neck supple  Skin:  no gross rash, warm, pink. Cardiac:  RRR, S1 S2 Respiratory: CTA B/L  Vascular:  Ext warm, no cyanosis apprec.; cap RF less 2 sec. Psych:  No HI/SI, judgement and insight good, Euthymic mood.  Full Affect.   No results found for any visits on 06/25/21.  Assessment & Plan      Problem List Items Addressed This Visit   None Visit Diagnoses     Anemia, unspecified type    -  Primary   Hyperkalemia          Anemia: -Discussed labs with patient. Hemoglobin has improved likely secondary to patient starting iron and B12 supplements. Recommend to continue with both supplements. Will repeat CBC, iron panel, B12/folate in 12 weeks.   Hyperlipidemia: -Discussed with patient lipid panel. Discussed reducing simple carbohydrate and sugar intake to improve triglycerides. Discussed exercise to help improve good cholesterol. Will repeat fasting lipid panel in 3 months.   Hyperkalemia: -Discussed recent potassium normalized. Will continue to monitor.   Return for as scheduled; lab visit in 3 months for cmp, cbc, iron panel, b12, lipid  panel.        Maritza Abonza, PA-C  [facility] [place] [phone] (phone) [phone] (fax)  [facility] 

## 2021-08-02 ENCOUNTER — Ambulatory Visit (INDEPENDENT_AMBULATORY_CARE_PROVIDER_SITE_OTHER): Payer: Managed Care, Other (non HMO) | Admitting: Family Medicine

## 2021-08-02 VITALS — BP 138/88 | HR 101 | Ht 64.0 in | Wt 156.6 lb

## 2021-08-02 DIAGNOSIS — G8929 Other chronic pain: Secondary | ICD-10-CM | POA: Diagnosis not present

## 2021-08-02 DIAGNOSIS — M25511 Pain in right shoulder: Secondary | ICD-10-CM

## 2021-08-02 DIAGNOSIS — M25512 Pain in left shoulder: Secondary | ICD-10-CM | POA: Diagnosis not present

## 2021-08-02 NOTE — Progress Notes (Signed)
   I, Philbert Riser, LAT, ATC acting as a scribe for Clementeen Graham, MD.  Walter Snow is a 56 y.o. male who presents to [facility] today for f/u bilat shoulder pain, R>L, predominantly thought to be due to subacromial impingement and bursitis and rotator cuff tendinitis. Pt works in Education administrator. Pt was last seen by Dr. Denyse Amass on 06/07/21 and was given bilat subacromial steroid injections and referred to PT, but no visits were scheduled. Today, pt reports he had an incident Sunday when he was climbing up scaffolding and felt a strain in his L shoulder. This pain has mostly resolved.   Dx imaging: 06/07/21 R & L shoulder XR  Pertinent review of systems: No fevers or chills  Relevant historical information: Hypertension   Exam:  BP 138/88   Pulse (!) 101   Ht 5\' 4"  (1.626 m)   Wt 156 lb 9.6 oz (71 kg)   SpO2 97%   BMI 26.88 kg/m  General: Well Developed, well nourished, and in no acute distress.   MSK: Bilateral shoulders: Normal appearing.  Normal motion.  Intact strength.  Negative impingement tests bilaterally.    Assessment and Plan: 56 y.o. male with bilateral shoulder pain: Doing well without much residual pain.  He had steroid injections bilaterally about 2 months ago which seems to work well.  Recommend home exercise program to maintain rotator cuff strength.  Recheck back as needed.  Can repeat injection or proceed with physical therapy in the future if needed.  Reviewed treatment plan and options going forward and potential backup plan or next steps.  Total encounter time 20 minutes including face-to-face time with the patient and, reviewing past medical record, and charting on the date of service.      Discussed warning signs or symptoms. Please see discharge instructions. Patient expresses understanding.   The above documentation has been reviewed and is accurate and complete 57, M.D.

## 2021-08-02 NOTE — Patient Instructions (Addendum)
Thank you for coming in today.   Let me know if you need me in the future!

## 2021-08-26 ENCOUNTER — Other Ambulatory Visit: Payer: Self-pay | Admitting: Physician Assistant

## 2021-09-25 ENCOUNTER — Other Ambulatory Visit: Payer: Managed Care, Other (non HMO)

## 2021-10-02 ENCOUNTER — Other Ambulatory Visit: Payer: Self-pay

## 2021-10-02 DIAGNOSIS — D649 Anemia, unspecified: Secondary | ICD-10-CM

## 2021-10-02 DIAGNOSIS — I10 Essential (primary) hypertension: Secondary | ICD-10-CM

## 2021-10-02 DIAGNOSIS — E785 Hyperlipidemia, unspecified: Secondary | ICD-10-CM

## 2021-10-02 DIAGNOSIS — E875 Hyperkalemia: Secondary | ICD-10-CM

## 2021-10-02 DIAGNOSIS — Z0000 Encounter for general adult medical examination without abnormal findings: Secondary | ICD-10-CM

## 2021-10-02 DIAGNOSIS — Z789 Other specified health status: Secondary | ICD-10-CM

## 2021-10-03 ENCOUNTER — Other Ambulatory Visit: Payer: Managed Care, Other (non HMO)

## 2021-10-03 DIAGNOSIS — E785 Hyperlipidemia, unspecified: Secondary | ICD-10-CM

## 2021-10-03 DIAGNOSIS — E875 Hyperkalemia: Secondary | ICD-10-CM

## 2021-10-03 DIAGNOSIS — Z0000 Encounter for general adult medical examination without abnormal findings: Secondary | ICD-10-CM

## 2021-10-03 DIAGNOSIS — Z789 Other specified health status: Secondary | ICD-10-CM

## 2021-10-03 DIAGNOSIS — I10 Essential (primary) hypertension: Secondary | ICD-10-CM

## 2021-10-03 DIAGNOSIS — D649 Anemia, unspecified: Secondary | ICD-10-CM

## 2021-10-04 ENCOUNTER — Other Ambulatory Visit: Payer: Self-pay | Admitting: Physician Assistant

## 2021-10-04 DIAGNOSIS — D649 Anemia, unspecified: Secondary | ICD-10-CM

## 2021-10-04 LAB — IRON: Iron: 96 ug/dL (ref 38–169)

## 2021-10-04 LAB — COMPREHENSIVE METABOLIC PANEL
ALT: 24 IU/L (ref 0–44)
AST: 25 IU/L (ref 0–40)
Albumin/Globulin Ratio: 1.7 (ref 1.2–2.2)
Albumin: 4.3 g/dL (ref 3.8–4.9)
Alkaline Phosphatase: 113 IU/L (ref 44–121)
BUN/Creatinine Ratio: 7 — ABNORMAL LOW (ref 9–20)
BUN: 9 mg/dL (ref 6–24)
Bilirubin Total: 0.7 mg/dL (ref 0.0–1.2)
CO2: 20 mmol/L (ref 20–29)
Calcium: 9.6 mg/dL (ref 8.7–10.2)
Chloride: 98 mmol/L (ref 96–106)
Creatinine, Ser: 1.27 mg/dL (ref 0.76–1.27)
Globulin, Total: 2.5 g/dL (ref 1.5–4.5)
Glucose: 88 mg/dL (ref 70–99)
Potassium: 4.7 mmol/L (ref 3.5–5.2)
Sodium: 137 mmol/L (ref 134–144)
Total Protein: 6.8 g/dL (ref 6.0–8.5)
eGFR: 67 mL/min/{1.73_m2} (ref >59)

## 2021-10-04 LAB — LIPID PANEL
Chol/HDL Ratio: 4.9 ratio (ref 0.0–5.0)
Cholesterol, Total: 138 mg/dL (ref 100–199)
HDL: 28 mg/dL — ABNORMAL LOW (ref >39)
LDL Chol Calc (NIH): 65 mg/dL (ref 0–99)
Triglycerides: 282 mg/dL — ABNORMAL HIGH (ref 0–149)
VLDL Cholesterol Cal: 45 mg/dL — ABNORMAL HIGH (ref 5–40)

## 2021-10-04 LAB — CBC WITH DIFFERENTIAL/PLATELET
Basophils Absolute: 0.1 10*3/uL (ref 0.0–0.2)
Basos: 1 %
EOS (ABSOLUTE): 0.2 10*3/uL (ref 0.0–0.4)
Eos: 3 %
Hematocrit: 56.7 % — ABNORMAL HIGH (ref 37.5–51.0)
Hemoglobin: 19.3 g/dL — ABNORMAL HIGH (ref 13.0–17.7)
Immature Grans (Abs): 0 10*3/uL (ref 0.0–0.1)
Immature Granulocytes: 0 %
Lymphocytes Absolute: 2.1 10*3/uL (ref 0.7–3.1)
Lymphs: 31 %
MCH: 32.2 pg (ref 26.6–33.0)
MCHC: 34 g/dL (ref 31.5–35.7)
MCV: 95 fL (ref 79–97)
Monocytes Absolute: 0.5 10*3/uL (ref 0.1–0.9)
Monocytes: 8 %
Neutrophils Absolute: 3.9 10*3/uL (ref 1.4–7.0)
Neutrophils: 57 %
Platelets: 202 10*3/uL (ref 150–450)
RBC: 6 x10E6/uL — ABNORMAL HIGH (ref 4.14–5.80)
RDW: 13.5 % (ref 11.6–15.4)
WBC: 6.9 10*3/uL (ref 3.4–10.8)

## 2021-10-04 LAB — VITAMIN B12: Vitamin B-12: 292 pg/mL (ref 232–1245)

## 2021-10-25 ENCOUNTER — Other Ambulatory Visit: Payer: Self-pay | Admitting: Physician Assistant

## 2021-10-25 DIAGNOSIS — I10 Essential (primary) hypertension: Secondary | ICD-10-CM

## 2021-10-25 DIAGNOSIS — E785 Hyperlipidemia, unspecified: Secondary | ICD-10-CM

## 2021-11-29 ENCOUNTER — Ambulatory Visit: Payer: Managed Care, Other (non HMO) | Admitting: Physician Assistant

## 2021-11-29 ENCOUNTER — Encounter: Payer: Self-pay | Admitting: Physician Assistant

## 2021-11-29 VITALS — BP 132/85 | HR 78 | Resp 18 | Ht 64.0 in | Wt 154.0 lb

## 2021-11-29 DIAGNOSIS — E785 Hyperlipidemia, unspecified: Secondary | ICD-10-CM

## 2021-11-29 DIAGNOSIS — I10 Essential (primary) hypertension: Secondary | ICD-10-CM | POA: Diagnosis not present

## 2021-11-29 DIAGNOSIS — D649 Anemia, unspecified: Secondary | ICD-10-CM

## 2021-11-29 DIAGNOSIS — F17200 Nicotine dependence, unspecified, uncomplicated: Secondary | ICD-10-CM

## 2021-11-29 NOTE — Patient Instructions (Signed)

## 2021-11-29 NOTE — Assessment & Plan Note (Signed)
-  Stable.  -Continue current medication regimen. -Will collect CMP for medication monitoring. 

## 2021-11-29 NOTE — Assessment & Plan Note (Signed)
-  Will collect lipid panel and hepatic function. Continue atorvastatin 20 mg daily. Recommend to follow a heart healthy diet low in fat.

## 2021-11-29 NOTE — Progress Notes (Signed)
Established patient visit   Patient: Walter Snow   DOB: [birth year]   57 y.o. Male  MRN: [id number] Visit Date: 11/29/2021  Chief Complaint  Patient presents with   Follow-up    Fasting   Hypertension   Hyperlipidemia   Subjective    HPI HPI     Follow-up    Additional comments: Fasting      Last edited by Gemma Payor, CMA on 11/29/2021  8:11 AM.      Patient presents for chronic follow-up visit.   HTN: Pt denies chest pain, palpitations, dizziness or lower swelling. Taking medication as directed without side effects.   HLD: Pt taking medication as directed without issues. No muscle cramps, muscle weakness or myalgias.   Anemia: Reports taking iron supplement every other day. No fatigue.  Tobacco use: Reports smoking is about the same.   Medications: Outpatient Medications Prior to Visit  Medication Sig   atorvastatin (LIPITOR) 20 MG tablet TAKE 1 TABLET BY MOUTH EVERY DAY   lisinopril (ZESTRIL) 20 MG tablet TAKE 1 TABLET BY MOUTH EVERY DAY   metoprolol succinate (TOPROL-XL) 25 MG 24 hr tablet TAKE 1 TABLET (25 MG TOTAL) BY MOUTH DAILY.   TADALAFIL PO Take by mouth.   No facility-administered medications prior to visit.    Review of Systems Review of Systems:  A fourteen system review of systems was performed and found to be positive as per HPI.     Objective    BP 132/85 (BP Location: Left Arm, Patient Position: Sitting, Cuff Size: Normal)   Pulse 78   Resp 18   Ht _0  (1.626 m)   Wt 154 lb (69.9 kg)   SpO2 98%   BMI 26.43 kg/m  BP Readings from Last 3 Encounters:  11/29/21 132/85  08/02/21 138/88  06/25/21 115/72   Wt Readings from Last 3 Encounters:  11/29/21 154 lb (69.9 kg)  08/02/21 156 lb 9.6 oz (71 kg)  06/25/21 156 lb (70.8 kg)    Physical Exam  General:  Well Developed, well nourished, appropriate for stated age.  Neuro:  Alert and oriented,  extra-ocular muscles intact  HEENT:  Normocephalic, atraumatic, neck supple   Skin:  no gross rash, warm, pink. Cardiac:  RRR, S1 S2 Respiratory: CTA B/L  Vascular:  Ext warm, no cyanosis apprec.; cap RF less 2 sec. Psych:  No HI/SI, judgement and insight good, Euthymic mood. Full Affect.   No results found for any visits on 11/29/21.  Assessment & Plan      Problem List Items Addressed This Visit       Cardiovascular and Mediastinum   HTN (hypertension) - Primary (Chronic)    -Stable. Continue current medication regimen. Will collect CMP for medication monitoring.       Relevant Orders   Comp Met (CMET)     Other   HLD (hyperlipidemia)    -Will collect lipid panel and hepatic function. Continue atorvastatin 20 mg daily. Recommend to follow a heart healthy diet low in fat.      Relevant Orders   Lipid Profile   Other Visit Diagnoses     Anemia, unspecified type       Relevant Orders   CBC w/Diff   Iron, TIBC and Ferritin Panel      Anemia: -Will repeat cbc and iron panel. Patient taking iron supplement every other day.  Tobacco use: -Recommend to reduce use and eventually quit.  Return in about 6 months (around 05/30/2022) for CPE and  FBW.        Lorrene Reid, PA-C  [facility] [phone] (phone) [phone] (fax)  [place]

## 2021-11-30 ENCOUNTER — Other Ambulatory Visit: Payer: Self-pay

## 2021-11-30 DIAGNOSIS — R718 Other abnormality of red blood cells: Secondary | ICD-10-CM

## 2021-11-30 DIAGNOSIS — D582 Other hemoglobinopathies: Secondary | ICD-10-CM

## 2021-11-30 LAB — CBC WITH DIFFERENTIAL/PLATELET
Basophils Absolute: 0.1 10*3/uL (ref 0.0–0.2)
Basos: 1 %
EOS (ABSOLUTE): 0.2 10*3/uL (ref 0.0–0.4)
Eos: 4 %
Hematocrit: 56.1 % — ABNORMAL HIGH (ref 37.5–51.0)
Hemoglobin: 19 g/dL — ABNORMAL HIGH (ref 13.0–17.7)
Immature Grans (Abs): 0 10*3/uL (ref 0.0–0.1)
Immature Granulocytes: 0 %
Lymphocytes Absolute: 1.5 10*3/uL (ref 0.7–3.1)
Lymphs: 25 %
MCH: 32 pg (ref 26.6–33.0)
MCHC: 33.9 g/dL (ref 31.5–35.7)
MCV: 95 fL (ref 79–97)
Monocytes Absolute: 0.4 10*3/uL (ref 0.1–0.9)
Monocytes: 7 %
Neutrophils Absolute: 3.9 10*3/uL (ref 1.4–7.0)
Neutrophils: 63 %
Platelets: 190 10*3/uL (ref 150–450)
RBC: 5.93 x10E6/uL — ABNORMAL HIGH (ref 4.14–5.80)
RDW: 15.4 % (ref 11.6–15.4)
WBC: 6.2 10*3/uL (ref 3.4–10.8)

## 2021-11-30 LAB — IRON,TIBC AND FERRITIN PANEL
Ferritin: 93 ng/mL (ref 30–400)
Iron Saturation: 37 % (ref 15–55)
Iron: 132 ug/dL (ref 38–169)
Total Iron Binding Capacity: 360 ug/dL (ref 250–450)
UIBC: 228 ug/dL (ref 111–343)

## 2021-11-30 LAB — COMPREHENSIVE METABOLIC PANEL
ALT: 20 IU/L (ref 0–44)
AST: 29 IU/L (ref 0–40)
Albumin/Globulin Ratio: 1.6 (ref 1.2–2.2)
Albumin: 4.4 g/dL (ref 3.8–4.9)
Alkaline Phosphatase: 125 IU/L — ABNORMAL HIGH (ref 44–121)
BUN/Creatinine Ratio: 10 (ref 9–20)
BUN: 12 mg/dL (ref 6–24)
Bilirubin Total: 1.2 mg/dL (ref 0.0–1.2)
CO2: 27 mmol/L (ref 20–29)
Calcium: 9.8 mg/dL (ref 8.7–10.2)
Chloride: 97 mmol/L (ref 96–106)
Creatinine, Ser: 1.25 mg/dL (ref 0.76–1.27)
Globulin, Total: 2.7 g/dL (ref 1.5–4.5)
Glucose: 90 mg/dL (ref 70–99)
Potassium: 4.8 mmol/L (ref 3.5–5.2)
Sodium: 135 mmol/L (ref 134–144)
Total Protein: 7.1 g/dL (ref 6.0–8.5)
eGFR: 68 mL/min/{1.73_m2} (ref >59)

## 2021-11-30 LAB — LIPID PANEL
Chol/HDL Ratio: 5.2 ratio — ABNORMAL HIGH (ref 0.0–5.0)
Cholesterol, Total: 152 mg/dL (ref 100–199)
HDL: 29 mg/dL — ABNORMAL LOW (ref >39)
LDL Chol Calc (NIH): 73 mg/dL (ref 0–99)
Triglycerides: 308 mg/dL — ABNORMAL HIGH (ref 0–149)
VLDL Cholesterol Cal: 50 mg/dL — ABNORMAL HIGH (ref 5–40)

## 2022-03-12 ENCOUNTER — Telehealth: Payer: Self-pay

## 2022-03-12 DIAGNOSIS — I10 Essential (primary) hypertension: Secondary | ICD-10-CM

## 2022-03-12 MED ORDER — METOPROLOL SUCCINATE ER 25 MG PO TB24: 25.0000 mg | ORAL_TABLET | Freq: Every day | ORAL | 0 refills | Status: DC

## 2022-03-12 NOTE — Telephone Encounter (Signed)
Pt is requesting a refill on: metoprolol succinate (TOPROL-XL) 25 MG 24 hr tablet   Pharmacy: [facility] ([facility] [place] - [address]   LOV [id number] ROV 05/30/22

## 2022-03-12 NOTE — Telephone Encounter (Signed)
90 day sent

## 2022-05-14 ENCOUNTER — Other Ambulatory Visit: Payer: Self-pay | Admitting: Family Medicine

## 2022-05-14 DIAGNOSIS — I10 Essential (primary) hypertension: Secondary | ICD-10-CM

## 2022-05-15 ENCOUNTER — Telehealth: Payer: Self-pay | Admitting: *Deleted

## 2022-05-15 DIAGNOSIS — I10 Essential (primary) hypertension: Secondary | ICD-10-CM

## 2022-05-15 DIAGNOSIS — E785 Hyperlipidemia, unspecified: Secondary | ICD-10-CM

## 2022-05-15 MED ORDER — METOPROLOL SUCCINATE ER 25 MG PO TB24: 25.0000 mg | ORAL_TABLET | Freq: Every day | ORAL | 0 refills | Status: DC

## 2022-05-15 MED ORDER — LISINOPRIL 20 MG PO TABS: 20.0000 mg | ORAL_TABLET | Freq: Every day | ORAL | 0 refills | Status: DC

## 2022-05-15 MED ORDER — ATORVASTATIN CALCIUM 20 MG PO TABS: 20.0000 mg | ORAL_TABLET | Freq: Every day | ORAL | 0 refills | Status: DC

## 2022-05-15 NOTE — Telephone Encounter (Signed)
Patient calling requesting refill on below.  He said that he requested like 10 days ago and hasn't heard anything. Pt just had a fill from PCP so he thinks maybe it is because it has [facility]'s name attached to them. Please send refills.   atorvastatin (LIPITOR) 20 MG tablet   lisinopril (ZESTRIL) 20 MG tablet   [facility] [address]    LOV:11/29/2021 ROV:05/30/22

## 2022-05-15 NOTE — Telephone Encounter (Signed)
Refill sent.

## 2022-05-30 ENCOUNTER — Ambulatory Visit (INDEPENDENT_AMBULATORY_CARE_PROVIDER_SITE_OTHER): Payer: Managed Care, Other (non HMO) | Admitting: Family Medicine

## 2022-05-30 ENCOUNTER — Encounter: Payer: Self-pay | Admitting: Family Medicine

## 2022-05-30 VITALS — BP 126/88 | HR 84 | Resp 18 | Ht 64.0 in | Wt 155.0 lb

## 2022-05-30 DIAGNOSIS — Z0000 Encounter for general adult medical examination without abnormal findings: Secondary | ICD-10-CM | POA: Diagnosis not present

## 2022-05-30 DIAGNOSIS — E663 Overweight: Secondary | ICD-10-CM | POA: Diagnosis not present

## 2022-05-30 DIAGNOSIS — I10 Essential (primary) hypertension: Secondary | ICD-10-CM | POA: Diagnosis not present

## 2022-05-30 DIAGNOSIS — E785 Hyperlipidemia, unspecified: Secondary | ICD-10-CM

## 2022-05-30 DIAGNOSIS — F17200 Nicotine dependence, unspecified, uncomplicated: Secondary | ICD-10-CM

## 2022-05-30 DIAGNOSIS — Z1159 Encounter for screening for other viral diseases: Secondary | ICD-10-CM

## 2022-05-30 DIAGNOSIS — Z6826 Body mass index (BMI) 26.0-26.9, adult: Secondary | ICD-10-CM

## 2022-05-30 NOTE — Assessment & Plan Note (Signed)
Blood pressure essentially at goal less than 130/80.  Continue lisinopril 20 mg daily, metoprolol 25 mg daily and ambulatory blood pressure monitoring.  Collecting CMP for medication monitoring.

## 2022-05-30 NOTE — Assessment & Plan Note (Signed)
Last lipid panel: LDL 73, HDL 29, triglycerides 308.  Repeating lipid panel today.  Continue atorvastatin 20 mg daily unless lab results warrant change.  Will continue to monitor.

## 2022-05-30 NOTE — Assessment & Plan Note (Signed)
Recommended to continue with efforts to discontinue smoking.  We discussed Chantix as a fantastic option to support him especially since it has been helpful in the past.  Recommended for him to call insurance to see if they will cover it since the out-of-pocket cost is high.  Patient will send me a MyChart message if he would like to start this medication/it is covered by insurance.

## 2022-05-30 NOTE — Addendum Note (Signed)
Addended by: Saralyn Pilar on: 05/30/2022 12:53 PM   Modules accepted: Orders

## 2022-05-30 NOTE — Progress Notes (Signed)
Complete physical exam  Patient: Walter Snow   DOB: April 10, 1965   57 y.o. Male  MRN: [id number]  Subjective:    Chief Complaint  Patient presents with   Annual Exam    Walter Snow is a 57 y.o. male who presents today for a complete physical exam. He reports consuming a general diet. The patient has a physically strenuous job, but has no regular exercise apart from work.  He generally feels well.  He does not have additional problems to discuss today.  He is still smoking, but he would like to stop.  Yesterday was the first day that he tried to cut back, and he successfully went the whole day without smoking.  He plans on doing the same thing today.  About 20 years ago, he did use Chantix which worked well for him before he "made excuses to stop it".  He may be interested in restarting Chantix if his insurance will cover it.   Most recent fall risk assessment:    05/30/2022    8:18 AM  Fall Risk   Falls in the past year? 0  Number falls in past yr: 0  Injury with Fall? 0  Risk for fall due to : No Fall Risks  Follow up Falls evaluation completed     Most recent depression and anxiety screenings:    05/30/2022    8:18 AM 11/29/2021    8:12 AM  PHQ 2/9 Scores  PHQ - 2 Score 0 0  PHQ- 9 Score 0 0      05/30/2022    8:18 AM 11/29/2021    8:12 AM 06/25/2021    3:52 PM 05/29/2021    8:13 AM  GAD 7 : Generalized Anxiety Score  Nervous, Anxious, on Edge 0 0 0 0  Control/stop worrying 0 0 0 0  Worry too much - different things 0 0 0 0  Trouble relaxing 0 0 0 0  Restless 0 0 0 0  Easily annoyed or irritable 0 0 0 0  Afraid - awful might happen 0 0 0 0  Total GAD 7 Score 0 0 0 0  Anxiety Difficulty Not difficult at all Not difficult at all Not difficult at all Not difficult at all    Patient Active Problem List   Diagnosis Date Noted   HLD (hyperlipidemia) 08/11/2017   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 No pertinent surgical history. Social History   Tobacco Use   Smoking status: Every Day    Packs/day: 2.00    Years: 30.00    Additional pack years: 0.00    Total pack years: 60.00    Types: Cigarettes    Passive exposure: Past   Smokeless tobacco: Never  Substance Use Topics   Alcohol use: Yes    Alcohol/week: 0.0 standard drinks of alcohol   Drug use: No   Family History  Problem Relation Age of Onset   Heart attack Mother    Heart attack Father    Cancer Father        Kidney   Heart disease Father    Diabetes Paternal Aunt    Diabetes Paternal Grandmother    Allergies  Allergen Reactions   Penicillins Hives     Patient Care Team: Melida Quitter, PA as PCP - General (Family Medicine)   Outpatient Medications Prior to Visit  Medication Sig   albuterol (VENTOLIN HFA) 108 (90  Base) MCG/ACT inhaler Inhale 2 puffs into the lungs every 6 (six) hours as needed.   atorvastatin (LIPITOR) 20 MG tablet Take 1 tablet (20 mg total) by mouth daily.   lisinopril (ZESTRIL) 20 MG tablet Take 1 tablet (20 mg total) by mouth daily.   metoprolol succinate (TOPROL-XL) 25 MG 24 hr tablet Take 1 tablet (25 mg total) by mouth daily.   TADALAFIL PO Take by mouth.   testosterone cypionate (DEPOTESTOSTERONE CYPIONATE) 200 MG/ML injection Inject 0.85 mg into the muscle once a week.   No facility-administered medications prior to visit.    Review of Systems  Constitutional:  Negative for chills, fever and malaise/fatigue.  HENT:  Negative for congestion and hearing loss.   Eyes:  Negative for blurred vision and double vision.  Respiratory:  Positive for cough (Ever since bad URI I, uses albuterol inhaler for acute relief). Negative for shortness of breath.   Cardiovascular:  Negative for chest pain, palpitations and leg swelling.  Gastrointestinal:  Negative for abdominal pain, constipation, diarrhea and heartburn.  Genitourinary:  Negative for  frequency and urgency.  Musculoskeletal:  Negative for myalgias and neck pain.  Neurological:  Negative for headaches.  Endo/Heme/Allergies:  Negative for polydipsia.  Psychiatric/Behavioral:  Negative for depression. The patient is not nervous/anxious.      Objective:    BP 126/88 (BP Location: Right Arm, Patient Position: Sitting, Cuff Size: Normal)   Pulse 84   Resp 18   Ht 5\' 4"  (1.626 m)   Wt 155 lb (70.3 kg)   SpO2 95%   BMI 26.61 kg/m    Physical Exam Constitutional:      General: He is not in acute distress.    Appearance: Normal appearance.  HENT:     Head: Normocephalic and atraumatic.     Right Ear: Ear canal and external ear normal.     Left Ear: Tympanic membrane, ear canal and external ear normal.     Ears:     Comments: Loose cerumen present in right ear canal, prevented TM visualization    Nose: Nose normal.     Mouth/Throat:     Mouth: Mucous membranes are moist.     Pharynx: Oropharynx is clear.  Eyes:     Extraocular Movements: Extraocular movements intact.     Conjunctiva/sclera: Conjunctivae normal.     Pupils: Pupils are equal, round, and reactive to light.  Cardiovascular:     Rate and Rhythm: Normal rate and regular rhythm.     Pulses: Normal pulses.     Heart sounds: Normal heart sounds.  Pulmonary:     Effort: Pulmonary effort is normal.     Breath sounds: Normal breath sounds. No wheezing, rhonchi or rales.  Abdominal:     General: Bowel sounds are normal.     Palpations: Abdomen is soft.  Musculoskeletal:        General: Normal range of motion.     Cervical back: Normal range of motion and neck supple.  Skin:    General: Skin is warm and dry.  Neurological:     Mental Status: He is alert and oriented to person, place, and time.  Psychiatric:        Mood and Affect: Mood normal.       Assessment & Plan:    Routine Health Maintenance and Physical Exam  Immunization History  Administered Date(s) Administered   Tdap 08/29/2014     Health Maintenance  Topic Date Due   COVID-19 Vaccine (1) Never  done   HIV Screening  Never done   Hepatitis C Screening  Never done   COLONOSCOPY (Pts 45-68yrs Insurance coverage will need to be confirmed)  Never done   Lung Cancer Screening  Never done   Zoster Vaccines- Shingrix (1 of 2) 08/30/2022 (Originally 06/26/2017)   INFLUENZA VACCINE  08/15/2022   DTaP/Tdap/Td (2 - Td or Tdap) 08/28/2024   HPV VACCINES  Aged Out   Patient declines COVID and flu vaccines.  We discussed the shingles vaccine, its indications, benefits, and potential side effects.  He requested additional information to read over at home before deciding if he is going to get it. Colonoscopy was done in October 2021 at [facility].  We will request copies of the results. He states that he got an updated tetanus shot from the [employer] city nurse in 2021 after getting a cut at work. Patient is agreeable to completing HIV and hepatitis C screenings with blood work today.  He is also agreeable to low-dose lung CT for lung cancer screening.  Discussed health benefits of physical activity, and encouraged him to engage in regular exercise appropriate for his age and condition.  Wellness examination -     CBC with Differential/Platelet; Future -     Comprehensive metabolic panel; Future -     Hemoglobin A1c; Future -     Lipid panel; Future -     VITAMIN D 25 Hydroxy (Vit-D Deficiency, Fractures); Future -     Hepatitis C antibody; Future -     HIV Antibody (routine testing w rflx); Future  Primary hypertension Assessment & Plan: Blood pressure essentially at goal less than 130/80.  Continue lisinopril 20 mg daily, metoprolol 25 mg daily and ambulatory blood pressure monitoring.  Collecting CMP for medication monitoring.  Orders: -     CBC with Differential/Platelet; Future -     Comprehensive metabolic panel; Future  Hyperlipidemia, unspecified hyperlipidemia type Assessment & Plan: Last lipid panel:  LDL 73, HDL 29, triglycerides 308.  Repeating lipid panel today.  Continue atorvastatin 20 mg daily unless lab results warrant change.  Will continue to monitor.  Orders: -     Lipid panel; Future  Overweight with body mass index (BMI) of 26 to 26.9 in adult -     CBC with Differential/Platelet; Future -     Comprehensive metabolic panel; Future -     Hemoglobin A1c; Future -     Lipid panel; Future -     VITAMIN D 25 Hydroxy (Vit-D Deficiency, Fractures); Future  Screening for viral disease -     Hepatitis C antibody; Future -     HIV Antibody (routine testing w rflx); Future  Tobacco use disorder Assessment & Plan: Recommended to continue with efforts to discontinue smoking.  We discussed Chantix as a fantastic option to support him especially since it has been helpful in the past.  Recommended for him to call insurance to see if they will cover it since the out-of-pocket cost is high.  Patient will send me a MyChart message if he would like to start this medication/it is covered by insurance.  Orders: -     CT CHEST LUNG CANCER SCREENING LOW DOSE WO CONTRAST; Future  Recommended starting allergy medicine like Zyrtec, Allegra, Claritin for ongoing cough/lung irritation.  Also likely that smoking cessation will be helpful to discontinue symptoms.  If these are not enough, we may consider a short taper of prednisone to decrease lung inflammation.  Return in about  6 months (around 11/30/2022) for follow-up for HTN, HLD, fasting blood work 1 week before.     Melida Quitter, PA

## 2022-05-30 NOTE — Patient Instructions (Signed)
It was nice to meet you today!  I will send you a message on MyChart once we have the results back from your lab work.  I would recommend trying an allergy medicine daily such as Zyrtec, Claritin, or Allegra.  I am hopeful that this will help your lungs to fully recover and get you back to feeling 100%.  If this is not sufficient, send me a message and we can try something to reduce the inflammation further.  It is fantastic that you are making efforts to cut back on smoking!  Keep up the good work, and if you would like to restart [facility] will cover it please let me know and I would be happy to get it started for you.

## 2022-05-31 ENCOUNTER — Other Ambulatory Visit: Payer: Self-pay | Admitting: Family Medicine

## 2022-05-31 DIAGNOSIS — E782 Mixed hyperlipidemia: Secondary | ICD-10-CM

## 2022-05-31 DIAGNOSIS — E559 Vitamin D deficiency, unspecified: Secondary | ICD-10-CM

## 2022-05-31 LAB — CBC WITH DIFFERENTIAL/PLATELET
Basophils Absolute: 0.1 10*3/uL (ref 0.0–0.2)
Basos: 1 %
EOS (ABSOLUTE): 0.2 10*3/uL (ref 0.0–0.4)
Eos: 2 %
Hematocrit: 51 % (ref 37.5–51.0)
Hemoglobin: 17.2 g/dL (ref 13.0–17.7)
Immature Grans (Abs): 0 10*3/uL (ref 0.0–0.1)
Immature Granulocytes: 0 %
Lymphocytes Absolute: 2 10*3/uL (ref 0.7–3.1)
Lymphs: 24 %
MCH: 31.4 pg (ref 26.6–33.0)
MCHC: 33.7 g/dL (ref 31.5–35.7)
MCV: 93 fL (ref 79–97)
Monocytes Absolute: 0.6 10*3/uL (ref 0.1–0.9)
Monocytes: 7 %
Neutrophils Absolute: 5.5 10*3/uL (ref 1.4–7.0)
Neutrophils: 66 %
Platelets: 212 10*3/uL (ref 150–450)
RBC: 5.48 x10E6/uL (ref 4.14–5.80)
RDW: 14.1 % (ref 11.6–15.4)
WBC: 8.3 10*3/uL (ref 3.4–10.8)

## 2022-05-31 LAB — COMPREHENSIVE METABOLIC PANEL
ALT: 24 IU/L (ref 0–44)
AST: 29 IU/L (ref 0–40)
Albumin/Globulin Ratio: 1.6 (ref 1.2–2.2)
Albumin: 4.2 g/dL (ref 3.8–4.9)
Alkaline Phosphatase: 126 IU/L — ABNORMAL HIGH (ref 44–121)
BUN/Creatinine Ratio: 11 (ref 9–20)
BUN: 14 mg/dL (ref 6–24)
Bilirubin Total: 0.8 mg/dL (ref 0.0–1.2)
CO2: 23 mmol/L (ref 20–29)
Calcium: 9.7 mg/dL (ref 8.7–10.2)
Chloride: 101 mmol/L (ref 96–106)
Creatinine, Ser: 1.27 mg/dL (ref 0.76–1.27)
Globulin, Total: 2.7 g/dL (ref 1.5–4.5)
Glucose: 92 mg/dL (ref 70–99)
Potassium: 4.8 mmol/L (ref 3.5–5.2)
Sodium: 138 mmol/L (ref 134–144)
Total Protein: 6.9 g/dL (ref 6.0–8.5)
eGFR: 67 mL/min/{1.73_m2} (ref >59)

## 2022-05-31 LAB — LIPID PANEL
Chol/HDL Ratio: 4.4 ratio (ref 0.0–5.0)
Cholesterol, Total: 161 mg/dL (ref 100–199)
HDL: 37 mg/dL — ABNORMAL LOW (ref >39)
LDL Chol Calc (NIH): 69 mg/dL (ref 0–99)
Triglycerides: 347 mg/dL — ABNORMAL HIGH (ref 0–149)
VLDL Cholesterol Cal: 55 mg/dL — ABNORMAL HIGH (ref 5–40)

## 2022-05-31 LAB — HEMOGLOBIN A1C
Est. average glucose Bld gHb Est-mCnc: 120 mg/dL
Hgb A1c MFr Bld: 5.8 % — ABNORMAL HIGH (ref 4.8–5.6)

## 2022-05-31 LAB — VITAMIN D 25 HYDROXY (VIT D DEFICIENCY, FRACTURES): Vit D, 25-Hydroxy: 25.5 ng/mL — ABNORMAL LOW (ref 30.0–100.0)

## 2022-05-31 LAB — HEPATITIS C ANTIBODY: Hep C Virus Ab: NONREACTIVE

## 2022-05-31 LAB — HIV ANTIBODY (ROUTINE TESTING W REFLEX): HIV Screen 4th Generation wRfx: NONREACTIVE

## 2022-05-31 MED ORDER — FENOFIBRATE 48 MG PO TABS
48.0000 mg | ORAL_TABLET | Freq: Every day | ORAL | 1 refills | Status: DC
Start: 2022-05-31 — End: 2022-11-29

## 2022-05-31 MED ORDER — VITAMIN D (ERGOCALCIFEROL) 1.25 MG (50000 UNIT) PO CAPS
50000.0000 [IU] | ORAL_CAPSULE | ORAL | 0 refills | Status: DC
Start: 2022-05-31 — End: 2023-08-04

## 2022-06-07 ENCOUNTER — Other Ambulatory Visit: Payer: Self-pay | Admitting: Family Medicine

## 2022-06-07 DIAGNOSIS — E785 Hyperlipidemia, unspecified: Secondary | ICD-10-CM

## 2022-06-07 DIAGNOSIS — I10 Essential (primary) hypertension: Secondary | ICD-10-CM

## 2022-06-18 ENCOUNTER — Ambulatory Visit (HOSPITAL_BASED_OUTPATIENT_CLINIC_OR_DEPARTMENT_OTHER): Payer: Managed Care, Other (non HMO)

## 2022-07-12 ENCOUNTER — Ambulatory Visit
Admission: RE | Admit: 2022-07-12 | Discharge: 2022-07-12 | Disposition: A | Discharge status: Home / Self Care | Payer: Managed Care, Other (non HMO) | Source: Ambulatory Visit | Attending: Family Medicine | Admitting: Family Medicine

## 2022-07-12 DIAGNOSIS — F17200 Nicotine dependence, unspecified, uncomplicated: Secondary | ICD-10-CM

## 2022-08-04 ENCOUNTER — Other Ambulatory Visit: Payer: Self-pay | Admitting: Family Medicine

## 2022-08-04 DIAGNOSIS — I10 Essential (primary) hypertension: Secondary | ICD-10-CM

## 2022-08-22 ENCOUNTER — Other Ambulatory Visit: Payer: Self-pay | Admitting: Family Medicine

## 2022-08-22 DIAGNOSIS — E559 Vitamin D deficiency, unspecified: Secondary | ICD-10-CM

## 2022-09-05 ENCOUNTER — Other Ambulatory Visit: Payer: Self-pay | Admitting: Family Medicine

## 2022-09-05 DIAGNOSIS — I10 Essential (primary) hypertension: Secondary | ICD-10-CM

## 2022-10-09 ENCOUNTER — Other Ambulatory Visit: Payer: Self-pay | Admitting: Family Medicine

## 2022-10-09 DIAGNOSIS — I10 Essential (primary) hypertension: Secondary | ICD-10-CM

## 2022-11-29 ENCOUNTER — Other Ambulatory Visit: Payer: Self-pay | Admitting: Family Medicine

## 2022-11-29 DIAGNOSIS — E782 Mixed hyperlipidemia: Secondary | ICD-10-CM

## 2022-12-03 ENCOUNTER — Encounter: Payer: Self-pay | Admitting: Family Medicine

## 2022-12-03 ENCOUNTER — Ambulatory Visit: Payer: Managed Care, Other (non HMO) | Admitting: Family Medicine

## 2022-12-03 VITALS — BP 122/72 | HR 108 | Resp 20 | Ht 64.0 in | Wt 158.0 lb

## 2022-12-03 DIAGNOSIS — K219 Gastro-esophageal reflux disease without esophagitis: Secondary | ICD-10-CM | POA: Diagnosis not present

## 2022-12-03 DIAGNOSIS — Z1212 Encounter for screening for malignant neoplasm of rectum: Secondary | ICD-10-CM

## 2022-12-03 DIAGNOSIS — E782 Mixed hyperlipidemia: Secondary | ICD-10-CM | POA: Diagnosis not present

## 2022-12-03 DIAGNOSIS — F17200 Nicotine dependence, unspecified, uncomplicated: Secondary | ICD-10-CM | POA: Diagnosis not present

## 2022-12-03 DIAGNOSIS — I10 Essential (primary) hypertension: Secondary | ICD-10-CM | POA: Diagnosis not present

## 2022-12-03 DIAGNOSIS — R0981 Nasal congestion: Secondary | ICD-10-CM

## 2022-12-03 DIAGNOSIS — Z1211 Encounter for screening for malignant neoplasm of colon: Secondary | ICD-10-CM

## 2022-12-03 MED ORDER — ATORVASTATIN CALCIUM 20 MG PO TABS: 20.0000 mg | ORAL_TABLET | Freq: Every day | ORAL | 1 refills | Status: DC

## 2022-12-03 MED ORDER — FENOFIBRATE 48 MG PO TABS: 48.0000 mg | ORAL_TABLET | Freq: Every day | ORAL | 0 refills | Status: DC

## 2022-12-03 MED ORDER — OMEPRAZOLE 20 MG PO CPDR: 20.0000 mg | DELAYED_RELEASE_CAPSULE | Freq: Every day | ORAL | 1 refills | Status: DC

## 2022-12-03 MED ORDER — LISINOPRIL 20 MG PO TABS: 20.0000 mg | ORAL_TABLET | Freq: Every day | ORAL | 1 refills | Status: DC

## 2022-12-03 MED ORDER — METOPROLOL SUCCINATE ER 25 MG PO TB24: 25.0000 mg | ORAL_TABLET | Freq: Every day | ORAL | 1 refills | Status: DC

## 2022-12-03 MED ORDER — GUAIFENESIN ER 600 MG PO TB12: 600.0000 mg | ORAL_TABLET | Freq: Two times a day (BID) | ORAL | 0 refills | Status: DC

## 2022-12-03 NOTE — Patient Instructions (Addendum)
FISH OIL: 2000 to 4000 mg daily  I have included the information about the shingles vaccines for you to read over.  I do highly recommend completing the 2 dose series as close to age 55 as you can to benefit from the strongest immune response since the immune system gets weaker as we get older.  You can have this done either in our office or at the pharmacy depending on your preference.  Keep up the great work, please let me know if you would like any additional support in smoking cessation.  You are doing an amazing job!

## 2022-12-03 NOTE — Assessment & Plan Note (Signed)
For now, continue omeprazole 20 mg daily.  Will continue to monitor.

## 2022-12-03 NOTE — Assessment & Plan Note (Signed)
Last lipid panel: LDL 69, HDL 37, triglycerides 347.  Repeating lipid panel today.  Continue atorvastatin 20 mg daily and fenofibrate 48 mg daily unless lab results warrant change.  Will continue to monitor.

## 2022-12-03 NOTE — Progress Notes (Signed)
Established Patient Office Visit  Subjective   Patient ID: Walter Snow, male    DOB: 1965/03/10  Age: 57 y.o. MRN: [id number]  Chief Complaint  Patient presents with   Hyperlipidemia   Hypertension    HPI Markis Howery is a 57 y.o. male presenting today for follow up of hypertension, hyperlipidemia.  He has also been experiencing nasal congestion after his children got sick. Hypertension: Patient here for follow-up of elevated blood pressure. He is active at his physically strenuous job.   Pt denies chest pain, SOB, dizziness, edema, syncope, fatigue or heart palpitations. Taking lisinopril and metoprolol succinate, reports excellent compliance with treatment. Denies side effects. Hyperlipidemia: tolerating atorvastatin as well as fenofibrate which was added after last lab results well with no myalgias or significant side effects.  The 10-year ASCVD risk score (Arnett DK, et al., 2019) is: 11.3%   Outpatient Medications Prior to Visit  Medication Sig   TADALAFIL PO Take by mouth.   testosterone cypionate (DEPOTESTOSTERONE CYPIONATE) 200 MG/ML injection Inject 0.85 mg into the muscle once a week.   Vitamin D, Ergocalciferol, (DRISDOL) 1.25 MG (50000 UNIT) CAPS capsule Take 1 capsule (50,000 Units total) by mouth every 7 (seven) days.   [DISCONTINUED] atorvastatin (LIPITOR) 20 MG tablet TAKE 1 TABLET BY MOUTH EVERY DAY   [DISCONTINUED] fenofibrate (TRICOR) 48 MG tablet Take 1 tablet by mouth once daily   [DISCONTINUED] lisinopril (ZESTRIL) 20 MG tablet TAKE 1 TABLET BY MOUTH EVERY DAY   [DISCONTINUED] metoprolol succinate (TOPROL-XL) 25 MG 24 hr tablet Take 1 tablet by mouth once daily   albuterol (VENTOLIN HFA) 108 (90 Base) MCG/ACT inhaler Inhale 2 puffs into the lungs every 6 (six) hours as needed.   Omega-3 Fatty Acids (FISH OIL) 1000 MG CAPS Take 2,000 mg by mouth daily.   No facility-administered medications prior to visit.    ROS Negative unless otherwise noted in HPI    Objective:     BP 122/72 (BP Location: Left Arm, Patient Position: Sitting, Cuff Size: Normal) Comment: Care everywhere value from last endo appt  Pulse (!) 108   Resp 20   Ht 5\' 4"  (1.626 m)   Wt 158 lb (71.7 kg)   SpO2 96%   BMI 27.12 kg/m   Physical Exam Constitutional:      General: He is not in acute distress.    Appearance: Normal appearance.  HENT:     Head: Normocephalic and atraumatic.  Cardiovascular:     Rate and Rhythm: Normal rate and regular rhythm.     Heart sounds: Normal heart sounds. No murmur heard.    No friction rub. No gallop.  Pulmonary:     Effort: Pulmonary effort is normal. No respiratory distress.     Breath sounds: Normal breath sounds. No wheezing, rhonchi or rales.  Skin:    General: Skin is warm and dry.  Neurological:     Mental Status: He is alert and oriented to person, place, and time.  Psychiatric:        Mood and Affect: Mood normal.     Assessment & Plan:  Primary hypertension Assessment & Plan: BP goal <130/80.  Continue lisinopril 20 mg daily, metoprolol 25 mg daily and ambulatory blood pressure monitoring.  Collecting CMP for medication monitoring.  Orders: -     CBC with Differential/Platelet; Future -     Comprehensive metabolic panel; Future -     Lisinopril; Take 1 tablet (20 mg total) by mouth daily.  Dispense: 90 tablet;  Refill: 1 -     Metoprolol Succinate ER; Take 1 tablet (25 mg total) by mouth daily.  Dispense: 90 tablet; Refill: 1  Mixed hyperlipidemia Assessment & Plan: Last lipid panel: LDL 69, HDL 37, triglycerides 347.  Repeating lipid panel today.  Continue atorvastatin 20 mg daily and fenofibrate 48 mg daily unless lab results warrant change.  Will continue to monitor.  Orders: -     Comprehensive metabolic panel; Future -     Lipid panel; Future -     Atorvastatin Calcium; Take 1 tablet (20 mg total) by mouth daily.  Dispense: 90 tablet; Refill: 1 -     Fenofibrate; Take 1 tablet (48 mg total) by mouth  daily.  Dispense: 30 tablet; Refill: 0  Tobacco use disorder Assessment & Plan: Continue efforts towards smoking cessation.  Not interested in Chantix at this time, but may be worthwhile to consider in the future based on insurance coverage.  Will continue to monitor.  Orders: -     CBC with Differential/Platelet; Future  Gastroesophageal reflux disease, unspecified whether esophagitis present Assessment & Plan: For now, continue omeprazole 20 mg daily.  Will continue to monitor.  Orders: -     Omeprazole; Take 1 capsule (20 mg total) by mouth daily.  Dispense: 90 capsule; Refill: 1  Nasal congestion -     guaiFENesin ER; Take 1 tablet (600 mg total) by mouth 2 (two) times daily.  Dispense: 14 tablet; Refill: 0  Screening for colorectal cancer -     Ambulatory referral to Gastroenterology  Recommend guaifenesin and adequate hydration for nasal congestion.  Agreeable to referral for updated colonoscopy.  Return in about 6 months (around 06/02/2023) for annual physical, fasting blood work 1 week before.    Melida Quitter, PA

## 2022-12-03 NOTE — Assessment & Plan Note (Signed)
BP goal <130/80.  Continue lisinopril 20 mg daily, metoprolol 25 mg daily and ambulatory blood pressure monitoring.  Collecting CMP for medication monitoring.

## 2022-12-03 NOTE — Assessment & Plan Note (Signed)
Continue efforts towards smoking cessation.  Not interested in Chantix at this time, but may be worthwhile to consider in the future based on insurance coverage.  Will continue to monitor.

## 2022-12-04 LAB — COMPREHENSIVE METABOLIC PANEL
ALT: 13 [IU]/L (ref 0–44)
AST: 22 [IU]/L (ref 0–40)
Albumin: 4.3 g/dL (ref 3.8–4.9)
Alkaline Phosphatase: 97 [IU]/L (ref 44–121)
BUN/Creatinine Ratio: 7 — ABNORMAL LOW (ref 9–20)
BUN: 10 mg/dL (ref 6–24)
Bilirubin Total: 0.6 mg/dL (ref 0.0–1.2)
CO2: 23 mmol/L (ref 20–29)
Calcium: 9.9 mg/dL (ref 8.7–10.2)
Chloride: 98 mmol/L (ref 96–106)
Creatinine, Ser: 1.53 mg/dL — ABNORMAL HIGH (ref 0.76–1.27)
Globulin, Total: 3.2 g/dL (ref 1.5–4.5)
Glucose: 87 mg/dL (ref 70–99)
Potassium: 4.6 mmol/L (ref 3.5–5.2)
Sodium: 136 mmol/L (ref 134–144)
Total Protein: 7.5 g/dL (ref 6.0–8.5)
eGFR: 53 mL/min/{1.73_m2} — ABNORMAL LOW (ref >59)

## 2022-12-04 LAB — LIPID PANEL
Chol/HDL Ratio: 5.7 ratio — ABNORMAL HIGH (ref 0.0–5.0)
Cholesterol, Total: 182 mg/dL (ref 100–199)
HDL: 32 mg/dL — ABNORMAL LOW (ref >39)
LDL Chol Calc (NIH): 108 mg/dL — ABNORMAL HIGH (ref 0–99)
Triglycerides: 243 mg/dL — ABNORMAL HIGH (ref 0–149)
VLDL Cholesterol Cal: 42 mg/dL — ABNORMAL HIGH (ref 5–40)

## 2022-12-04 LAB — CBC WITH DIFFERENTIAL/PLATELET
Basophils Absolute: 0.1 10*3/uL (ref 0.0–0.2)
Basos: 1 %
EOS (ABSOLUTE): 0.3 10*3/uL (ref 0.0–0.4)
Eos: 3 %
Hematocrit: 46.2 % (ref 37.5–51.0)
Hemoglobin: 15.2 g/dL (ref 13.0–17.7)
Immature Grans (Abs): 0 10*3/uL (ref 0.0–0.1)
Immature Granulocytes: 0 %
Lymphocytes Absolute: 2.3 10*3/uL (ref 0.7–3.1)
Lymphs: 27 %
MCH: 29.5 pg (ref 26.6–33.0)
MCHC: 32.9 g/dL (ref 31.5–35.7)
MCV: 90 fL (ref 79–97)
Monocytes Absolute: 0.8 10*3/uL (ref 0.1–0.9)
Monocytes: 9 %
Neutrophils Absolute: 5.1 10*3/uL (ref 1.4–7.0)
Neutrophils: 60 %
Platelets: 291 10*3/uL (ref 150–450)
RBC: 5.16 x10E6/uL (ref 4.14–5.80)
RDW: 15.3 % (ref 11.6–15.4)
WBC: 8.5 10*3/uL (ref 3.4–10.8)

## 2022-12-05 ENCOUNTER — Other Ambulatory Visit: Payer: Self-pay | Admitting: Family Medicine

## 2022-12-05 DIAGNOSIS — I10 Essential (primary) hypertension: Secondary | ICD-10-CM

## 2022-12-10 ENCOUNTER — Other Ambulatory Visit: Payer: Self-pay | Admitting: Family Medicine

## 2022-12-10 DIAGNOSIS — I10 Essential (primary) hypertension: Secondary | ICD-10-CM

## 2022-12-10 DIAGNOSIS — E782 Mixed hyperlipidemia: Secondary | ICD-10-CM

## 2023-01-20 ENCOUNTER — Ambulatory Visit: Payer: Managed Care, Other (non HMO) | Admitting: Family Medicine

## 2023-01-20 ENCOUNTER — Ambulatory Visit (INDEPENDENT_AMBULATORY_CARE_PROVIDER_SITE_OTHER): Payer: Managed Care, Other (non HMO)

## 2023-01-20 VITALS — BP 152/94 | HR 101 | Ht 64.0 in | Wt 158.0 lb

## 2023-01-20 DIAGNOSIS — M542 Cervicalgia: Secondary | ICD-10-CM | POA: Diagnosis not present

## 2023-01-20 DIAGNOSIS — G8929 Other chronic pain: Secondary | ICD-10-CM | POA: Diagnosis not present

## 2023-01-20 DIAGNOSIS — M5412 Radiculopathy, cervical region: Secondary | ICD-10-CM | POA: Diagnosis not present

## 2023-01-20 MED ORDER — GABAPENTIN 300 MG PO CAPS: 300.0000 mg | ORAL_CAPSULE | Freq: Every day | ORAL | 1 refills | Status: DC

## 2023-01-20 MED ORDER — PREDNISONE 50 MG PO TABS: ORAL_TABLET | ORAL | 0 refills | Status: DC

## 2023-01-20 NOTE — Patient Instructions (Addendum)
 Thank you for coming in today.   Please get an Xray today before you leave   I've referred you to Physical Therapy.  Let us know if you don't hear from them in one week.   Check back in 6 weeks

## 2023-01-20 NOTE — Progress Notes (Signed)
   LILLETTE Ileana Collet, PhD, LAT, ATC acting as a scribe for Artist Lloyd, MD.  Atwood Adcock is a 58 y.o. male who presents to [facility] at [facility] today for neck pain. Pt was previously seen by Dr. Lloyd on 08/02/21 for bilat shoulder pain.  Today, pt c/o neck pain ongoing for a year, worsening over the last month. Injury occurred at work when he was carrying something and fell backwards. Pt locates pain to either side of his neck. Pain is disturbing his sleep.   Radiates: yes into R shoulder UE Numbness/tingling: yes UE Weakness: no Aggravates: cervical rotation Treatments tried: Tylenol  Pertinent review of systems: No fevers or chills  Relevant historical information: Hypertension   Exam:  BP (!) 152/94   Pulse (!) 101   Ht 5' 4 (1.626 m)   Wt 158 lb (71.7 kg)   SpO2 98%   BMI 27.12 kg/m  General: Well Developed, well nourished, and in no acute distress.   MSK: C-spine: Normal appearing Nontender palpation spinal midline.  Tender palpation cervical paraspinal musculature. Normal cervical motion. Mildly positive right-sided Spurling's test.  Right shoulder normal-appearing normal motion.  Tender palpation right trapezius otherwise nontender.  Normal shoulder motion and strength. Negative impingement testing.  Reflexes and strength intact upper extremities bilaterally.    Lab and Radiology Results  X-ray images cervical spine obtained today personally and independently interpreted. Loss of cervical lordosis indicating spasm.  No acute fractures.  No severe DJD. Await formal radiology review     Assessment and Plan: 58 y.o. male with chronic cervical paraspinal pain and right cervical and shoulder pain due to cervical radiculopathy and muscle spasm and dysfunction.  We discussed options.  Plan for trial of physical therapy.  Also prescribed prednisone  and gabapentin  to use if symptoms worsen.  Recheck 8 weeks.  If not improved cervical MRI would  be reasonable.   PDMP not reviewed this encounter. Orders Placed This Encounter  Procedures   DG Cervical Spine 2 or 3 views    Standing Status:   Future    Number of Occurrences:   1    Expiration Date:   01/20/2024    Reason for Exam (SYMPTOM  OR DIAGNOSIS REQUIRED):   neck pain    Preferred imaging location?:   [facility]   Ambulatory referral to Physical Therapy    Referral Priority:   Routine    Referral Type:   Physical Medicine    Referral Reason:   Specialty Services Required    Requested Specialty:   Physical Therapy    Number of Visits Requested:   1   Meds ordered this encounter  Medications   gabapentin  (NEURONTIN ) 300 MG capsule    Sig: Take 1 capsule (300 mg total) by mouth at bedtime.    Dispense:  90 capsule    Refill:  1   predniSONE  (DELTASONE ) 50 MG tablet    Sig: Take 1 pill daily for 5 days    Dispense:  5 tablet    Refill:  0     Discussed warning signs or symptoms. Please see discharge instructions. Patient expresses understanding.   The above documentation has been reviewed and is accurate and complete Artist Lloyd, M.D.

## 2023-01-27 NOTE — Progress Notes (Signed)
 Cervical spine x-ray looks normal to radiology.  No broken bones are present.  There is not a lot of arthritis that is visible on this x-ray either.

## 2023-01-30 NOTE — Therapy (Signed)
OUTPATIENT PHYSICAL THERAPY CERVICAL EVALUATION   Patient Name: Walter Snow MRN: [id number] DOB:01-31-1965, 58 y.o., male Today's Date: 02/04/2023  END OF SESSION:  PT End of Session - 02/04/23 1532     Visit Number 1    Date for PT Re-Evaluation 03/18/23    Authorization Type [employer]    Authorization Time Period Prior auth required after visit 5    PT Start Time 1531    PT Stop Time 1617    PT Time Calculation (min) 46 min    Activity Tolerance Patient tolerated treatment well    Behavior During Therapy WFL for tasks assessed/performed             Past Medical History:  Diagnosis Date   ADD (attention deficit disorder)    Allergy    Anemia    Anxiety    Arthritis    Hypertension    No past surgical history on file. 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   HTN (hypertension) 01/21/2014   Tobacco use disorder 01/21/2014    PCP: Melida Quitter, PA   REFERRING PROVIDER: Rodolph Bong, MD  REFERRING DIAG: [phone] (ICD-10-CM) - Neck pain, chronic   THERAPY DIAG:  Cervicalgia  Cramp and spasm  Rationale for Evaluation and Treatment: Rehabilitation  ONSET DATE: ~ 1 year ago  SUBJECTIVE:                                                                                                                                                                                                         SUBJECTIVE STATEMENT: At least a year ago carrying things out of building going backwards, tripped backwards, thought it would go away, it didn't, went to boss, tried to submit through workman's comp but it had been too long.  Bothers mostly driving and sleeping, looking left and right, not too bad today.   Top of neck to bottom.   Also gets intense headaches, seems to start base of spine, seems to help if hangs head.   Hasn't taken the  prednisone prescribed, gabapentin helped with sleep but made extremely sleepy the next Snow so only took 2 days.   Having a lot of wrist and hand pain bilaterally as well.    Hand dominance: Right  PERTINENT HISTORY:  ADD, HTN, GERD, tobacco abuse  PAIN:  Are you having pain? Yes: NPRS scale: 1-5/10  Pain location: sides of neck Pain description: muscle pain Aggravating factors:  turning Relieving factors: keeping head straight  PRECAUTIONS: None  RED FLAGS: None     WEIGHT BEARING RESTRICTIONS: No  FALLS:  Has patient fallen in last 6 months? 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 Denyse Amass  OBJECTIVE:   DIAGNOSTIC FINDINGS:  01/20/2023 DG Cervical spine FINDINGS: No fracture, bone lesion or spondylolisthesis.   Disc spaces are well preserved.   Soft tissues are unremarkable.  PATIENT SURVEYS:  NDI 9/50  COGNITION: Overall cognitive status: Within functional limits for tasks assessed  SENSATION: WFL  POSTURE: rounded shoulders and forward head  PALPATION: Tenderness/tightness bil UT/levator scapulae and cervical paraspinals.    CERVICAL ROM:   Active ROM A/PROM (deg) eval  Flexion 24  Extension 27  Right lateral flexion 15  Left lateral flexion 21  Right rotation 46  Left rotation 40   (Blank rows = not tested)  UPPER EXTREMITY ROM:  decreased R shoulder Abduction compared to Left, reports history of cortisone injections for bursitis,   UPPER EXTREMITY MMT:  MMT Right eval Left eval  Shoulder flexion 5 5  Shoulder extension    Shoulder abduction 5 5  Shoulder internal rotation 5 5  Shoulder external rotation 5 5  Elbow flexion 5 5  Elbow extension 5 5  Wrist flexion 4 4  Wrist extension 4 4  Grip strength 70 70   (Blank rows =  not tested)  CERVICAL SPECIAL TESTS:  Spurling's test: Negative  TODAY'S TREATMENT:                                                                                                                              DATE:   02/04/2023 EVAL Manual Therapy: to decrease muscle spasm and pain and improve mobility STM/TPR to bil UT, levators, cervical paraspinals, skilled palpation and monitoring during dry needling. Trigger Point Dry Needling  Initial Treatment: Pt instructed on Dry Needling rational, procedures, and possible side effects. Pt instructed to expect mild to moderate muscle soreness later in the Snow and/or into the next Snow.  Pt instructed in methods to reduce muscle soreness. Pt instructed to continue prescribed HEP. Because Dry Needling was performed over or adjacent to a lung field, pt was educated on S/S of pneumothorax and to seek immediate medical attention should they occur.  Patient was educated on signs and symptoms of infection and other risk factors and advised to seek medical attention should they occur.  Patient verbalized understanding of these instructions and education.   Patient Verbal Consent Given: Yes Education Handout Provided: Yes Muscles Treated: bil C5/6 multifidi, bil UT Electrical Stimulation Performed: No Treatment Response/Outcome: Twitch Response Elicited and Palpable Increase in Muscle Length  Self Care: Findings, POC, education on anatomy and posture, initial HEP  PATIENT EDUCATION:  Education details: see self care, initial HEP, TrDN Person educated: Patient Education method: Explanation, Demonstration, Verbal cues, and Handouts Education comprehension: verbalized understanding and returned demonstration  HOME EXERCISE PROGRAM: Access Code: [email or web address] URL: [email or web address] Date: 02/04/2023 Prepared by: Harrie Foreman  Exercises - Seated Scapular Retraction  - 2 x daily - 7 x weekly - 1 sets - 10 reps - 5 sec hold - Seated  Cervical Retraction  - 2 x daily - 7 x weekly - 1 sets - 10 reps - 5 sec hold - Gentle Levator Scapulae Stretch  - 2 x daily - 7 x weekly - 1 sets - 3 reps - 15 sec hold  ASSESSMENT:  CLINICAL IMPRESSION: Walter Snow  is a 58 y.o. right hand dominant male who was seen today for physical therapy evaluation and treatment for neck pain after injury 1 year ago.  He demonstrates significant spasm and tenderness in bil UT/LS and cervical paraspinals and has significant ROM limitations in neck impairing safety with activities like driving.  He also has history of R shoulder injury and pain.  After explanation of DN rational, procedures, outcomes and potential side effects, patient verbalized consent to DN treatment in conjunction with manual STM/DTM and TPR to reduce ttp/muscle tension. Muscles treated as indicated above. DN produced normal response with good twitches elicited resulting in palpable reduction in pain/ttp and muscle tension, with patient noting less pain upon initiation of movement following DN. Pt educated to expect mild to moderate muscle soreness for up to 24-48 hrs and instructed to continue prescribed home exercise program and current activity level with pt verbalizing understanding of theses instructions.   He was also given initial HEP for gentle stretches.  Walter Snow would benefit from skilled physical therapy to decrease neck pain and improve improved activity tolerance.    OBJECTIVE IMPAIRMENTS: decreased activity tolerance, decreased ROM, decreased strength, increased fascial restrictions, increased muscle spasms, impaired flexibility, and pain.   ACTIVITY LIMITATIONS: bending and sleeping  PARTICIPATION LIMITATIONS: driving and occupation  PERSONAL FACTORS: Time since onset of injury/illness/exacerbation and 1-2 comorbidities: history R shoulder injuries/ bil wrist pain  are also affecting patient's functional outcome.   REHAB POTENTIAL: Good  CLINICAL DECISION MAKING:  Stable/uncomplicated  EVALUATION COMPLEXITY: Low   GOALS: Goals reviewed with patient? Yes  SHORT TERM GOALS: Target date: 02/18/2023   Patient will be independent with initial HEP.  Baseline:  Goal status: INITIAL  LONG TERM GOALS: Target date: 03/18/2023   Patient will be independent with advanced/ongoing HEP to improve outcomes and carryover.  Baseline:  Goal status: INITIAL  2.  Patient will report 75% improvement in neck pain to improve QOL.  Baseline:  Goal status: INITIAL  3.  Patient will demonstrate full pain free cervical ROM for safety with driving.  Baseline: see objective Goal status: INITIAL  4.  Patient will report at least 8 points improvement on NDI to demonstrate improved functional ability.  Baseline: 9/50 Goal status: INITIAL  PLAN:  PT FREQUENCY: 1-2x/week  PT DURATION: 6 weeks  PLANNED INTERVENTIONS: 97110-Therapeutic exercises, 97530- Therapeutic activity, 97112- Neuromuscular re-education, 97535- Self Care, 16109- Manual therapy, 97014- Electrical stimulation (unattended), [id number]- Electrical stimulation (manual), [id number]- Ultrasound, 60454- Traction (mechanical), Patient/Family education, Taping, Dry Needling, Joint mobilization, Joint manipulation, Spinal manipulation, Spinal mobilization, Cryotherapy, and Moist heat  PLAN FOR NEXT SESSION: assess response to TrDN, progress HEP for postural strengthening, McKenzie neck exercises, continue manual/TrDN to neck.    Jena Gauss, PT, DPT 02/04/2023, 5:46 PM

## 2023-02-04 ENCOUNTER — Ambulatory Visit: Payer: Managed Care, Other (non HMO) | Attending: Family Medicine | Admitting: Physical Therapy

## 2023-02-04 ENCOUNTER — Other Ambulatory Visit: Payer: Self-pay

## 2023-02-04 DIAGNOSIS — G8929 Other chronic pain: Secondary | ICD-10-CM | POA: Insufficient documentation

## 2023-02-04 DIAGNOSIS — R252 Cramp and spasm: Secondary | ICD-10-CM | POA: Diagnosis present

## 2023-02-04 DIAGNOSIS — M542 Cervicalgia: Secondary | ICD-10-CM | POA: Insufficient documentation

## 2023-02-04 NOTE — Patient Instructions (Signed)

## 2023-02-13 ENCOUNTER — Ambulatory Visit: Payer: Managed Care, Other (non HMO) | Admitting: Physical Therapy

## 2023-02-13 ENCOUNTER — Encounter: Payer: Self-pay | Admitting: Physical Therapy

## 2023-02-13 DIAGNOSIS — R252 Cramp and spasm: Secondary | ICD-10-CM

## 2023-02-13 DIAGNOSIS — M542 Cervicalgia: Secondary | ICD-10-CM

## 2023-02-13 NOTE — Therapy (Signed)
OUTPATIENT PHYSICAL THERAPY CERVICAL EVALUATION   Patient Name: Stclair Szymborski MRN: [id number] DOB:02-08-1965, 58 y.o., male Today's Date: 02/13/2023  END OF SESSION:  PT End of Session - 02/13/23 1623     Visit Number 2    Date for PT Re-Evaluation 03/18/23    Authorization Type [employer]    Authorization Time Period Prior auth required after visit 5    PT Start Time 1622    PT Stop Time 1705    PT Time Calculation (min) 43 min    Activity Tolerance Patient tolerated treatment well    Behavior During Therapy WFL for tasks assessed/performed             Past Medical History:  Diagnosis Date   ADD (attention deficit disorder)    Allergy    Anemia    Anxiety    Arthritis    Hypertension    History reviewed. No pertinent surgical history. 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   HTN (hypertension) 01/21/2014   Tobacco use disorder 01/21/2014    PCP: Melida Quitter, PA   REFERRING PROVIDER: Rodolph Bong, MD  REFERRING DIAG: [phone] (ICD-10-CM) - Neck pain, chronic   THERAPY DIAG:  Cervicalgia  Cramp and spasm  Rationale for Evaluation and Treatment: Rehabilitation  ONSET DATE: ~ 1 year ago  SUBJECTIVE:                                                                                                                                                                                                         SUBJECTIVE STATEMENT: 02/13/2023 Today is a good day, yesterday a different story because was troweling cement all day.  Bought braces for wrists which helped today.  DN helped for 2-3 days.    From Eval: At least a year ago carrying things out of building going backwards, tripped backwards, thought it would go away, it didn't, went to boss, tried to submit through workman's comp but it had been too long.  Bothers mostly  driving and sleeping, looking left and right, not too bad today.   Top of neck to bottom.   Also gets intense headaches, seems to start base of spine, seems to help if hangs head.   Hasn't taken the prednisone prescribed, gabapentin helped with sleep but made extremely sleepy the next day so only took 2 days.   Having a lot of wrist and hand pain bilaterally as well.  Hand dominance: Right  PERTINENT HISTORY:  ADD, HTN, GERD, tobacco abuse  PAIN:  Are you having pain? Yes: NPRS scale: 1-5/10  Pain location: sides of neck Pain description: muscle pain Aggravating factors: turning Relieving factors: keeping head straight  PRECAUTIONS: None  RED FLAGS: None     WEIGHT BEARING RESTRICTIONS: No  FALLS:  Has patient fallen in last 6 months? 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 Denyse Amass  OBJECTIVE:   DIAGNOSTIC FINDINGS:  01/20/2023 DG Cervical spine FINDINGS: No fracture, bone lesion or spondylolisthesis.   Disc spaces are well preserved.   Soft tissues are unremarkable.  PATIENT SURVEYS:  NDI 9/50  COGNITION: Overall cognitive status: Within functional limits for tasks assessed  SENSATION: WFL  POSTURE: rounded shoulders and forward head  PALPATION: Tenderness/tightness bil UT/levator scapulae and cervical paraspinals.    CERVICAL ROM:   Active ROM A/PROM (deg) eval  Flexion 24  Extension 27  Right lateral flexion 15  Left lateral flexion 21  Right rotation 46  Left rotation 40   (Blank rows = not tested)  UPPER EXTREMITY ROM:  decreased R shoulder Abduction compared to Left, reports history of cortisone injections for bursitis,   UPPER EXTREMITY MMT:  MMT Right eval Left eval  Shoulder flexion 5 5  Shoulder extension    Shoulder  abduction 5 5  Shoulder internal rotation 5 5  Shoulder external rotation 5 5  Elbow flexion 5 5  Elbow extension 5 5  Wrist flexion 4 4  Wrist extension 4 4  Grip strength 70 70   (Blank rows = not tested)  CERVICAL SPECIAL TESTS:  Spurling's test: Negative  TODAY'S TREATMENT:                                                                                                                              DATE:   02/13/23 Therapeutic Exercise: to improve strength and mobility.  Demo, verbal and tactile cues throughout for technique. UBE forward x 4 min Chin tucks x 10 - cues for technique Resisted chin tucks RTB x 10 SNAGs with pillow case - extension and rotation  Scapular squeezes with resistance - RTB x 10 S/L open books x 10  Prone on elbows with cervical extension x 10 Manual Therapy: to decrease muscle spasm and pain and improve mobility STM/TPR to cervical paraspinals, PA/UPA mobs thoracic spine, skilled palpation and monitoring during dry needling. Trigger Point Dry Needling  Subsequent Treatment: Instructions provided previously at initial dry needling treatment.   Patient Verbal Consent Given: Yes Education Handout Provided: Previously Provided Muscles Treated: bil cervical multifidi C4-6 Electrical Stimulation Performed: No Treatment Response/Outcome: Twitch Response Elicited and Palpable Increase in Muscle Length  02/04/2023 EVAL Manual Therapy: to decrease muscle spasm and pain  and improve mobility STM/TPR to bil UT, levators, cervical paraspinals, skilled palpation and monitoring during dry needling. Trigger Point Dry Needling  Initial Treatment: Pt instructed on Dry Needling rational, procedures, and possible side effects. Pt instructed to expect mild to moderate muscle soreness later in the day and/or into the next day.  Pt instructed in methods to reduce muscle soreness. Pt instructed to continue prescribed HEP. Because Dry Needling was performed over or  adjacent to a lung field, pt was educated on S/S of pneumothorax and to seek immediate medical attention should they occur.  Patient was educated on signs and symptoms of infection and other risk factors and advised to seek medical attention should they occur.  Patient verbalized understanding of these instructions and education.   Patient Verbal Consent Given: Yes Education Handout Provided: Yes Muscles Treated: bil C5/6 multifidi, bil UT Electrical Stimulation Performed: No Treatment Response/Outcome: Twitch Response Elicited and Palpable Increase in Muscle Length  Self Care: Findings, POC, education on anatomy and posture, initial HEP  PATIENT EDUCATION:  Education details: HEP update, review TrDN aftercare Person educated: Patient Education method: Explanation, Demonstration, Verbal cues, and Handouts Education comprehension: verbalized understanding and returned demonstration  HOME EXERCISE PROGRAM: Access Code: [email or web address] URL: [email or web address] Date: 02/13/2023 Prepared by: Harrie Foreman  Exercises - Seated Scapular Retraction  - 2 x daily - 7 x weekly - 1 sets - 10 reps - 5 sec hold - Seated Cervical Retraction  - 2 x daily - 7 x weekly - 1 sets - 10 reps - 5 sec hold - Gentle Levator Scapulae Stretch  - 2 x daily - 7 x weekly - 1 sets - 3 reps - 15 sec hold - Sidelying Open Book Thoracic Lumbar Rotation and Extension  - 1 x daily - 7 x weekly - 1 sets - 10 reps - Seated Assisted Cervical Rotation with Towel  - 1 x daily - 7 x weekly - 1 sets - 10 reps - Cervical Retraction with Resistance  - 1 x daily - 7 x weekly - 1 sets - 10 reps - Cervical Extension AROM with Strap  - 1 x daily - 7 x weekly - 1 sets - 10 reps - Shoulder External Rotation and Scapular Retraction with Resistance  - 1 x daily - 7 x weekly - 1 sets - 10 reps - Cervical Extension Prone on Elbows  - 1 x daily - 7 x weekly - 1 sets - 10 reps  ASSESSMENT:  CLINICAL IMPRESSION: Channin Agustin reports good compliance initial HEP and good results from initial session of TrDN.  Today reviewed and progressed HEP, and continued manual therapy.  Still had very strong twitch response in cervical multifidi.  Noted increased thoracic kyphosis, given prone on elbows and s/l open books for thoracic mobility as well.  Garlon Tuggle continues to demonstrate potential for improvement and would benefit from continued skilled therapy to address impairments.      OBJECTIVE IMPAIRMENTS: decreased activity tolerance, decreased ROM, decreased strength, increased fascial restrictions, increased muscle spasms, impaired flexibility, and pain.   ACTIVITY LIMITATIONS: bending and sleeping  PARTICIPATION LIMITATIONS: driving and occupation  PERSONAL FACTORS: Time since onset of injury/illness/exacerbation and 1-2 comorbidities: history R shoulder injuries/ bil wrist pain  are also affecting patient's functional outcome.   REHAB POTENTIAL: Good  CLINICAL DECISION MAKING: Stable/uncomplicated  EVALUATION COMPLEXITY: Low   GOALS: Goals reviewed with patient? Yes  SHORT TERM GOALS: Target date: 02/18/2023   Patient will be independent with initial HEP.  Baseline:  Goal status: IN PROGRESS  LONG TERM GOALS: Target date: 03/18/2023   Patient will be independent with advanced/ongoing HEP to improve outcomes and carryover.  Baseline:  Goal status: IN PROGRESS  2.  Patient will report 75% improvement in neck pain to improve QOL.  Baseline:  Goal status: IN PROGRESS  3.  Patient will demonstrate full pain free cervical ROM for safety with driving.  Baseline: see objective Goal status: IN PROGRESS  4.  Patient will report at least 8 points improvement on NDI to demonstrate improved functional ability.  Baseline: 9/50 Goal status: IN PROGRESS  PLAN:  PT FREQUENCY: 1-2x/week  PT DURATION: 6 weeks  PLANNED INTERVENTIONS: 97110-Therapeutic exercises, 97530- Therapeutic activity, 97112-  Neuromuscular re-education, 97535- Self Care, 28413- Manual therapy, 97014- Electrical stimulation (unattended), [id number]- Electrical stimulation (manual), [id number]- Ultrasound, 24401- Traction (mechanical), Patient/Family education, Taping, Dry Needling, Joint mobilization, Joint manipulation, Spinal manipulation, Spinal mobilization, Cryotherapy, and Moist heat  PLAN FOR NEXT SESSION: continue to progress thoracic mobility, manual/TrDN as indicated.    Jena Gauss, PT, DPT 02/13/2023, 5:25 PM

## 2023-02-17 ENCOUNTER — Encounter: Payer: Self-pay | Admitting: Family Medicine

## 2023-02-17 MED ORDER — LIDOCAINE 4 % EX PTCH: 1.0000 | MEDICATED_PATCH | CUTANEOUS | 2 refills | Status: DC

## 2023-02-17 NOTE — Telephone Encounter (Signed)
Forwarding to Dr. Corey.  

## 2023-02-20 ENCOUNTER — Encounter: Payer: Self-pay | Admitting: Family Medicine

## 2023-02-20 ENCOUNTER — Ambulatory Visit: Payer: Managed Care, Other (non HMO) | Attending: Family Medicine | Admitting: Physical Therapy

## 2023-02-25 ENCOUNTER — Ambulatory Visit: Payer: Managed Care, Other (non HMO) | Attending: Family Medicine | Admitting: Physical Therapy

## 2023-02-25 ENCOUNTER — Encounter: Payer: Self-pay | Admitting: Physical Therapy

## 2023-02-25 DIAGNOSIS — R252 Cramp and spasm: Secondary | ICD-10-CM | POA: Insufficient documentation

## 2023-02-25 DIAGNOSIS — M542 Cervicalgia: Secondary | ICD-10-CM | POA: Insufficient documentation

## 2023-02-25 NOTE — Therapy (Addendum)
 OUTPATIENT PHYSICAL THERAPY CERVICAL Treatment/ Discharge Summary   Patient Name: Walter Snow MRN: [id number] DOB:August 19, 1965, 58 y.o., male Today's Date: 02/25/2023  END OF SESSION:  PT End of Session - 02/25/23 1615     Visit Number 3    Date for PT Re-Evaluation 03/18/23    Authorization Type [employer]    Authorization Time Period Prior auth required after visit 5    PT Start Time 1615    PT Stop Time 1702    PT Time Calculation (min) 47 min    Activity Tolerance Patient tolerated treatment well    Behavior During Therapy WFL for tasks assessed/performed             Past Medical History:  Diagnosis Date   ADD (attention deficit disorder)    Allergy    Anemia    Anxiety    Arthritis    Hypertension    History reviewed. No pertinent surgical history. 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   HTN (hypertension) 01/21/2014   Tobacco use disorder 01/21/2014    PCP: Melida Quitter, PA   REFERRING PROVIDER: Rodolph Bong, MD  REFERRING DIAG: [phone] (ICD-10-CM) - Neck pain, chronic   THERAPY DIAG:  Cervicalgia  Cramp and spasm  Rationale for Evaluation and Treatment: Rehabilitation  ONSET DATE: ~ 1 year ago  SUBJECTIVE:                                                                                                                                                                                                         SUBJECTIVE STATEMENT: 02/25/2023 Not having barely any neck pain, easier to turn head when driving.  Likes the pillow case exercises.    From Eval: At least a year ago carrying things out of building going backwards, tripped backwards, thought it would go away, it didn't, went to boss, tried to submit through workman's comp but it had been too long.  Bothers mostly driving and sleeping, looking left and  right, not too bad today.   Top of neck to bottom.   Also gets intense headaches, seems to start base of spine, seems to help if hangs head.   Hasn't taken the prednisone prescribed, gabapentin helped with sleep but made extremely sleepy the next day so only took 2 days.   Having a lot of wrist and hand pain bilaterally as well.    Hand dominance: Right  PERTINENT HISTORY:  ADD, HTN,  GERD, tobacco abuse  PAIN:  Are you having pain? Yes: NPRS scale: 1-5/10  Pain location: sides of neck Pain description: muscle pain Aggravating factors: turning Relieving factors: keeping head straight  PRECAUTIONS: None  RED FLAGS: None     WEIGHT BEARING RESTRICTIONS: No  FALLS:  Has patient fallen in last 6 months? 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 Denyse Amass  OBJECTIVE:   DIAGNOSTIC FINDINGS:  01/20/2023 DG Cervical spine FINDINGS: No fracture, bone lesion or spondylolisthesis.   Disc spaces are well preserved.   Soft tissues are unremarkable.  PATIENT SURVEYS:  NDI 9/50  COGNITION: Overall cognitive status: Within functional limits for tasks assessed  SENSATION: WFL  POSTURE: rounded shoulders and forward head  PALPATION: Tenderness/tightness bil UT/levator scapulae and cervical paraspinals.    CERVICAL ROM:   Active ROM A/PROM (deg) eval  Flexion 24  Extension 27  Right lateral flexion 15  Left lateral flexion 21  Right rotation 46  Left rotation 40   (Blank rows = not tested)  UPPER EXTREMITY ROM:  decreased R shoulder Abduction compared to Left, reports history of cortisone injections for bursitis,   UPPER EXTREMITY MMT:  MMT Right eval Left eval  Shoulder flexion 5 5  Shoulder extension    Shoulder abduction 5 5  Shoulder internal rotation 5  5  Shoulder external rotation 5 5  Elbow flexion 5 5  Elbow extension 5 5  Wrist flexion 4 4  Wrist extension 4 4  Grip strength 70 70   (Blank rows = not tested)  CERVICAL SPECIAL TESTS:  Spurling's test: Negative  TODAY'S TREATMENT:                                                                                                                              DATE:   02/25/2023 Therapeutic Exercise: to improve strength and mobility.  Demo, verbal and tactile cues throughout for technique. - Cat Cow   10 reps - Quadruped Full Range Thoracic Rotation with Reach  -  10 reps R/L - Supine Chest Stretch on Pool Noodle  -- Thoracic Foam Roll Mobilization Backstroke  - 10 reps - Open Book Chest Rotation Stretch on Foam 1/2 Roll  - 10 reps - [employer] on [employer]  - 10 reps HEP update and review Manual Therapy: to decrease muscle spasm and pain and improve mobility PA mobs thoracic spine, UPA mobs thoracic spine, STM/TPR to cervical paraspinals, PA/UPA mobs cervical spine, NAGs into rotation, skilled palpation and monitoring during dry needling. Trigger Point Dry Needling  Subsequent Treatment: Instructions reviewed, if requested by the patient, prior to subsequent dry needling treatment.   Patient Verbal Consent Given: Yes Education Handout Provided: Previously Provided Muscles Treated: L lower trap, L rhomboid, L  T10 multifidi Electrical Stimulation Performed: No Treatment Response/Outcome: Twitch Response Elicited and Palpable Increase in Muscle Length Modalities: MHP to cervical spine while HEP being prepared.   02/13/23 Therapeutic Exercise: to improve strength and mobility.  Demo, verbal and tactile cues throughout for technique. UBE forward x 4 min Chin tucks x 10 - cues for technique Resisted chin tucks RTB x 10 SNAGs with pillow case - extension and rotation  Scapular squeezes with resistance - RTB x 10 S/L open books x 10  Prone on elbows with cervical extension x 10 Manual  Therapy: to decrease muscle spasm and pain and improve mobility STM/TPR to cervical paraspinals, PA/UPA mobs thoracic spine, skilled palpation and monitoring during dry needling. Trigger Point Dry Needling  Subsequent Treatment: Instructions provided previously at initial dry needling treatment.   Patient Verbal Consent Given: Yes Education Handout Provided: Previously Provided Muscles Treated: bil cervical multifidi C4-6 Electrical Stimulation Performed: No Treatment Response/Outcome: Twitch Response Elicited and Palpable Increase in Muscle Length  02/04/2023 EVAL Manual Therapy: to decrease muscle spasm and pain and improve mobility STM/TPR to bil UT, levators, cervical paraspinals, skilled palpation and monitoring during dry needling. Trigger Point Dry Needling  Initial Treatment: Pt instructed on Dry Needling rational, procedures, and possible side effects. Pt instructed to expect mild to moderate muscle soreness later in the day and/or into the next day.  Pt instructed in methods to reduce muscle soreness. Pt instructed to continue prescribed HEP. Because Dry Needling was performed over or adjacent to a lung field, pt was educated on S/S of pneumothorax and to seek immediate medical attention should they occur.  Patient was educated on signs and symptoms of infection and other risk factors and advised to seek medical attention should they occur.  Patient verbalized understanding of these instructions and education.   Patient Verbal Consent Given: Yes Education Handout Provided: Yes Muscles Treated: bil C5/6 multifidi, bil UT Electrical Stimulation Performed: No Treatment Response/Outcome: Twitch Response Elicited and Palpable Increase in Muscle Length  Self Care: Findings, POC, education on anatomy and posture, initial HEP  PATIENT EDUCATION:  Education details: HEP update, review TrDN aftercare and precautions for TrDN over lung fields and s/s pneumothorax Person educated:  Patient Education method: Explanation, Demonstration, Verbal cues, and Handouts Education comprehension: verbalized understanding and returned demonstration  HOME EXERCISE PROGRAM: Access Code: [email or web address] URL: [email or web address] Date: 02/25/2023 Prepared by: Harrie Foreman  Exercises - Seated Scapular Retraction  - 2 x daily - 7 x weekly - 1 sets - 10 reps - 5 sec hold - Gentle Levator Scapulae Stretch  - 2 x daily - 7 x weekly - 1 sets - 3 reps - 15 sec hold - Sidelying Open Book Thoracic Lumbar Rotation and Extension  - 1 x daily - 7 x weekly - 1 sets - 10 reps - Seated Assisted Cervical Rotation with Towel  - 1 x daily - 7 x weekly - 1 sets - 10 reps - Cervical Retraction with Resistance  - 1 x daily - 7 x weekly - 1 sets - 10 reps - Cervical Extension AROM with Strap  - 1 x daily - 7 x weekly - 1 sets - 10 reps - Shoulder External Rotation and Scapular Retraction with Resistance  - 1 x daily - 7 x weekly - 1 sets - 10 reps - Cervical Extension Prone on Elbows  - 1 x daily - 7 x weekly - 3 sets - 10 reps - Cat Cow  - 1 x daily - 7  x weekly - 1 sets - 10 reps - Modified Cat Cow on Counter  - 1 x daily - 7 x weekly - 1 sets - 10 reps - Quadruped Full Range Thoracic Rotation with Reach  - 1 x daily - 7 x weekly - 1 sets - 10 reps - Plank with Thoracic Rotation on Counter  - 1 x daily - 7 x weekly - 1 sets - 10 reps - Supine Chest Stretch on Foam Roll  - 1 x daily - 7 x weekly - 1 sets - 10 reps - Thoracic Foam Roll Mobilization Backstroke  - 1 x daily - 7 x weekly - 1 sets - 10 reps - Open Book Chest Rotation Stretch on Foam 1/2 Roll  - 1 x daily - 7 x weekly - 1 sets - 10 reps - [employer] on Foam Roll  - 1 x daily - 7 x weekly - 1 sets - 10 reps  ASSESSMENT:  CLINICAL IMPRESSION: Camari Wisham reports improvement in pain and cervical ROM.  Today progressed exercises focusing on thoracic mobility, reporting only pain in lower thoracic spine/lower traps.  Large  palpable trigger point in left lower trap, resolved after TrDN.   Hosteen Kienast continues to demonstrate potential for improvement and would benefit from continued skilled therapy to address impairments.      OBJECTIVE IMPAIRMENTS: decreased activity tolerance, decreased ROM, decreased strength, increased fascial restrictions, increased muscle spasms, impaired flexibility, and pain.   ACTIVITY LIMITATIONS: bending and sleeping  PARTICIPATION LIMITATIONS: driving and occupation  PERSONAL FACTORS: Time since onset of injury/illness/exacerbation and 1-2 comorbidities: history R shoulder injuries/ bil wrist pain  are also affecting patient's functional outcome.   REHAB POTENTIAL: Good  CLINICAL DECISION MAKING: Stable/uncomplicated  EVALUATION COMPLEXITY: Low   GOALS: Goals reviewed with patient? Yes  SHORT TERM GOALS: Target date: 02/18/2023   Patient will be independent with initial HEP.  Baseline:  Goal status: MET 02/25/23 pillow case exercises especially helpful  LONG TERM GOALS: Target date: 03/18/2023   Patient will be independent with advanced/ongoing HEP to improve outcomes and carryover.  Baseline:  Goal status: IN PROGRESS 02/25/23- updated today  2.  Patient will report 75% improvement in neck pain to improve QOL.  Baseline:  Goal status: IN PROGRESS 02/25/23 - minimal pain reported  3.  Patient will demonstrate full pain free cervical ROM for safety with driving.  Baseline: see objective Goal status: IN PROGRESS 02/25/23- reports less difficulty driving and turning neck  4.  Patient will report at least 8 points improvement on NDI to demonstrate improved functional ability.  Baseline: 9/50 Goal status: IN PROGRESS  PLAN:  PT FREQUENCY: 1-2x/week  PT DURATION: 6 weeks  PLANNED INTERVENTIONS: 97110-Therapeutic exercises, 97530- Therapeutic activity, 97112- Neuromuscular re-education, 97535- Self Care, 40981- Manual therapy, 97014- Electrical stimulation (unattended),  [id number]- Electrical stimulation (manual), [id number]- Ultrasound, 19147- Traction (mechanical), Patient/Family education, Taping, Dry Needling, Joint mobilization, Joint manipulation, Spinal manipulation, Spinal mobilization, Cryotherapy, and Moist heat  PLAN FOR NEXT SESSION: continue to progress thoracic mobility, manual/TrDN as indicated.    Jena Gauss, PT, DPT 02/25/2023, 5:18 PM  PHYSICAL THERAPY DISCHARGE SUMMARY  Visits from Start of Care: 3  Current functional level related to goals / functional outcomes: Decreased pain, improved cervical AROM   Remaining deficits: See above   Education / Equipment: HEP  Plan: Patient is being discharged due to not returning to PT after 02/25/2023.   Jena Gauss, PT  04/09/2023 10:51 AM

## 2023-03-03 ENCOUNTER — Ambulatory Visit: Payer: Managed Care, Other (non HMO) | Admitting: Family Medicine

## 2023-03-04 ENCOUNTER — Encounter: Payer: Managed Care, Other (non HMO) | Admitting: Physical Therapy

## 2023-03-06 ENCOUNTER — Encounter: Payer: Self-pay | Admitting: Family Medicine

## 2023-03-06 ENCOUNTER — Ambulatory Visit: Payer: Managed Care, Other (non HMO) | Admitting: Family Medicine

## 2023-03-06 VITALS — BP 122/78 | HR 101 | Ht 64.0 in | Wt 168.0 lb

## 2023-03-06 DIAGNOSIS — G8929 Other chronic pain: Secondary | ICD-10-CM

## 2023-03-06 DIAGNOSIS — M542 Cervicalgia: Secondary | ICD-10-CM

## 2023-03-06 NOTE — Progress Notes (Signed)
   I, Stevenson Clinch, CMA acting as a scribe for Clementeen Graham, MD.  Walter Snow is a 58 y.o. male who presents to [facility] today for f/u neck pain. Pt was last seen by Dr. Denyse Amass on 01/20/23  and was prescribed prednisone, Gabapentin, and was referred to PT, completing 3 visits. He was later prescribed lidocaine patches.  Today, pt reports some improvement of sx with PT. Not using Lidocaine Patches. Takes Gabapentin prn. Notes about 50% improvement of sx. Somewhat compliant with HEP. Denies new or worsening sx.   Dx imaging: 01/20/23 C-spine XR  Pertinent review of systems: No fevers or chills  Relevant historical information: Hypertension   Exam:  BP 122/78   Pulse (!) 101   Ht 5\' 4"  (1.626 m)   Wt 168 lb (76.2 kg)   SpO2 98%   BMI 28.84 kg/m  General: Well Developed, well nourished, and in no acute distress.   MSK: C-spine normal cervical motion.  Upper extremity strength is intact.    Lab and Radiology Results  DG Cervical Spine 2 or 3 views Result Date: 01/27/2023 CLINICAL DATA:  Neck pain for 1 year following an injury. EXAM: CERVICAL SPINE - 2-3 VIEW COMPARISON:  None Available. FINDINGS: No fracture, bone lesion or spondylolisthesis. Disc spaces are well preserved. Soft tissues are unremarkable. IMPRESSION: Negative cervical spine radiographs. Electronically Signed   By: Amie Portland M.D.   On: 01/27/2023 08:41   I, Clementeen Graham, personally (independently) visualized and performed the interpretation of the images attached in this note.      Assessment and Plan: 58 y.o. male with chronic neck pain.  Improved with physical therapy.  He is about 50% better but is happy with how he is doing.  He does not have any more PT scheduled.  Continue home exercise program and check back as needed.  Could add more PT in the future.  Ultimately if worsening or not improving we could do an MRI but that is not necessary at this time.  Recheck as  needed.   PDMP not reviewed this encounter. No orders of the defined types were placed in this encounter.  No orders of the defined types were placed in this encounter.    Discussed warning signs or symptoms. Please see discharge instructions. Patient expresses understanding.   The above documentation has been reviewed and is accurate and complete Clementeen Graham, M.D.

## 2023-03-06 NOTE — Patient Instructions (Signed)
Thank you for coming in today.   Check back as needed 

## 2023-03-27 ENCOUNTER — Other Ambulatory Visit: Payer: Self-pay | Admitting: Family Medicine

## 2023-03-27 DIAGNOSIS — E782 Mixed hyperlipidemia: Secondary | ICD-10-CM

## 2023-05-26 ENCOUNTER — Other Ambulatory Visit: Payer: Managed Care, Other (non HMO)

## 2023-06-02 ENCOUNTER — Encounter: Payer: Managed Care, Other (non HMO) | Admitting: Family Medicine

## 2023-06-06 ENCOUNTER — Other Ambulatory Visit: Payer: Self-pay | Admitting: Family Medicine

## 2023-06-06 DIAGNOSIS — I10 Essential (primary) hypertension: Secondary | ICD-10-CM

## 2023-06-06 DIAGNOSIS — K219 Gastro-esophageal reflux disease without esophagitis: Secondary | ICD-10-CM

## 2023-06-06 NOTE — Telephone Encounter (Signed)
 Tried to reach out but Voice mail was full. Sent Mychart message to call to schedule appt before requested refills would be filled

## 2023-06-10 NOTE — Telephone Encounter (Signed)
 Please deny the medication request. Can't reach me.

## 2023-06-29 ENCOUNTER — Other Ambulatory Visit: Payer: Self-pay | Admitting: Family Medicine

## 2023-06-29 DIAGNOSIS — E782 Mixed hyperlipidemia: Secondary | ICD-10-CM

## 2023-07-21 ENCOUNTER — Other Ambulatory Visit: Payer: Self-pay | Admitting: *Deleted

## 2023-07-21 DIAGNOSIS — I10 Essential (primary) hypertension: Secondary | ICD-10-CM

## 2023-07-21 DIAGNOSIS — Z131 Encounter for screening for diabetes mellitus: Secondary | ICD-10-CM

## 2023-07-21 DIAGNOSIS — E559 Vitamin D deficiency, unspecified: Secondary | ICD-10-CM

## 2023-07-21 DIAGNOSIS — E782 Mixed hyperlipidemia: Secondary | ICD-10-CM

## 2023-07-22 ENCOUNTER — Other Ambulatory Visit

## 2023-07-22 DIAGNOSIS — E559 Vitamin D deficiency, unspecified: Secondary | ICD-10-CM

## 2023-07-22 DIAGNOSIS — I10 Essential (primary) hypertension: Secondary | ICD-10-CM

## 2023-07-22 DIAGNOSIS — E782 Mixed hyperlipidemia: Secondary | ICD-10-CM

## 2023-07-22 DIAGNOSIS — Z131 Encounter for screening for diabetes mellitus: Secondary | ICD-10-CM

## 2023-07-23 ENCOUNTER — Ambulatory Visit: Payer: Self-pay

## 2023-07-23 LAB — HEMOGLOBIN A1C
Est. average glucose Bld gHb Est-mCnc: 114 mg/dL
Hgb A1c MFr Bld: 5.6 % (ref 4.8–5.6)

## 2023-07-23 LAB — VITAMIN D 25 HYDROXY (VIT D DEFICIENCY, FRACTURES): Vit D, 25-Hydroxy: 29.5 ng/mL — ABNORMAL LOW (ref 30.0–100.0)

## 2023-07-23 LAB — COMPREHENSIVE METABOLIC PANEL WITH GFR
ALT: 11 IU/L (ref 0–44)
AST: 21 IU/L (ref 0–40)
Albumin: 4.1 g/dL (ref 3.8–4.9)
Alkaline Phosphatase: 104 IU/L (ref 44–121)
BUN/Creatinine Ratio: 7 — ABNORMAL LOW (ref 9–20)
BUN: 11 mg/dL (ref 6–24)
Bilirubin Total: 0.6 mg/dL (ref 0.0–1.2)
CO2: 21 mmol/L (ref 20–29)
Calcium: 9.6 mg/dL (ref 8.7–10.2)
Chloride: 96 mmol/L (ref 96–106)
Creatinine, Ser: 1.6 mg/dL — ABNORMAL HIGH (ref 0.76–1.27)
Globulin, Total: 2.9 g/dL (ref 1.5–4.5)
Glucose: 113 mg/dL — ABNORMAL HIGH (ref 70–99)
Potassium: 4.6 mmol/L (ref 3.5–5.2)
Sodium: 134 mmol/L (ref 134–144)
Total Protein: 7 g/dL (ref 6.0–8.5)
eGFR: 50 mL/min/1.73 — ABNORMAL LOW (ref >59)

## 2023-07-23 LAB — CBC WITH DIFFERENTIAL/PLATELET
Basophils Absolute: 0.1 x10E3/uL (ref 0.0–0.2)
Basos: 1 %
EOS (ABSOLUTE): 0.1 x10E3/uL (ref 0.0–0.4)
Eos: 1 %
Hematocrit: 47.9 % (ref 37.5–51.0)
Hemoglobin: 15.3 g/dL (ref 13.0–17.7)
Immature Grans (Abs): 0 x10E3/uL (ref 0.0–0.1)
Immature Granulocytes: 0 %
Lymphocytes Absolute: 2.1 x10E3/uL (ref 0.7–3.1)
Lymphs: 16 %
MCH: 28.5 pg (ref 26.6–33.0)
MCHC: 31.9 g/dL (ref 31.5–35.7)
MCV: 89 fL (ref 79–97)
Monocytes Absolute: 0.7 x10E3/uL (ref 0.1–0.9)
Monocytes: 5 %
Neutrophils Absolute: 10.1 x10E3/uL — ABNORMAL HIGH (ref 1.4–7.0)
Neutrophils: 77 %
Platelets: 257 x10E3/uL (ref 150–450)
RBC: 5.36 x10E6/uL (ref 4.14–5.80)
RDW: 16.6 % — ABNORMAL HIGH (ref 11.6–15.4)
WBC: 13.2 x10E3/uL — ABNORMAL HIGH (ref 3.4–10.8)

## 2023-07-23 LAB — TSH: TSH: 2.85 u[IU]/mL (ref 0.450–4.500)

## 2023-07-23 LAB — LIPID PANEL
Chol/HDL Ratio: 6.8 ratio — ABNORMAL HIGH (ref 0.0–5.0)
Cholesterol, Total: 169 mg/dL (ref 100–199)
HDL: 25 mg/dL — ABNORMAL LOW (ref >39)
LDL Chol Calc (NIH): 89 mg/dL (ref 0–99)
Triglycerides: 330 mg/dL — ABNORMAL HIGH (ref 0–149)
VLDL Cholesterol Cal: 55 mg/dL — ABNORMAL HIGH (ref 5–40)

## 2023-07-26 ENCOUNTER — Other Ambulatory Visit: Payer: Self-pay | Admitting: Family Medicine

## 2023-07-26 DIAGNOSIS — E782 Mixed hyperlipidemia: Secondary | ICD-10-CM

## 2023-07-29 ENCOUNTER — Encounter

## 2023-07-31 NOTE — Telephone Encounter (Signed)
 Contacted pt and appt scheduled.

## 2023-08-04 ENCOUNTER — Ambulatory Visit (INDEPENDENT_AMBULATORY_CARE_PROVIDER_SITE_OTHER)

## 2023-08-04 VITALS — BP 118/77 | HR 96 | Temp 98.0°F | Ht 64.0 in | Wt 155.1 lb

## 2023-08-04 DIAGNOSIS — R7989 Other specified abnormal findings of blood chemistry: Secondary | ICD-10-CM | POA: Insufficient documentation

## 2023-08-04 DIAGNOSIS — I10 Essential (primary) hypertension: Secondary | ICD-10-CM

## 2023-08-04 DIAGNOSIS — F17200 Nicotine dependence, unspecified, uncomplicated: Secondary | ICD-10-CM

## 2023-08-04 DIAGNOSIS — R197 Diarrhea, unspecified: Secondary | ICD-10-CM | POA: Insufficient documentation

## 2023-08-04 DIAGNOSIS — Z1211 Encounter for screening for malignant neoplasm of colon: Secondary | ICD-10-CM | POA: Diagnosis not present

## 2023-08-04 DIAGNOSIS — Z122 Encounter for screening for malignant neoplasm of respiratory organs: Secondary | ICD-10-CM

## 2023-08-04 DIAGNOSIS — E782 Mixed hyperlipidemia: Secondary | ICD-10-CM

## 2023-08-04 DIAGNOSIS — E559 Vitamin D deficiency, unspecified: Secondary | ICD-10-CM | POA: Insufficient documentation

## 2023-08-04 DIAGNOSIS — Z1212 Encounter for screening for malignant neoplasm of rectum: Secondary | ICD-10-CM

## 2023-08-04 MED ORDER — VITAMIN D3 50 MCG (2000 UT) PO CAPS: 2000.0000 [IU] | ORAL_CAPSULE | Freq: Every day | ORAL | 2 refills | Status: AC

## 2023-08-04 MED ORDER — LIDOCAINE 4 % EX PTCH: 1.0000 | MEDICATED_PATCH | CUTANEOUS | 2 refills | Status: AC

## 2023-08-04 NOTE — Assessment & Plan Note (Signed)
 Hypertension is well-controlled and patient is not diabetic.  Given that EGFR/creatinine has been suboptimal for over a year, will obtain ultrasound of bilateral kidneys to rule out other causes.

## 2023-08-04 NOTE — Progress Notes (Signed)
 Complete physical exam  Patient: Walter Snow   DOB: Feb 04, 1965   58 y.o. Male  MRN: [id number]  Subjective:    Chief Complaint  Patient presents with   New Patient (Initial Visit)    Walter Snow is a 58 y.o. male who presents today for a complete physical exam. He reports consuming a general diet. He exercises as tolerated. He generally feels well. He reports sleeping well. He does have additional problems to discuss today.   Patient reports that his wife found a roundworm in her stool a few months ago and was treated with ivermectin. Patient reports that over the last several weeks, he has been experiencing intermittent cramping diarrhea but has not noticed any visible worms in his stool. Denies blood in the stool, fevers, or weight loss.    Most recent fall risk assessment:    08/04/2023    3:27 PM  Fall Risk   Falls in the past year? 0  Risk for fall due to : No Fall Risks  Follow up Falls evaluation completed     Most recent depression screenings:    08/04/2023    3:28 PM 05/30/2022    8:18 AM  PHQ 2/9 Scores  PHQ - 2 Score 1 0  PHQ- 9 Score 1 0        Patient Care Team: Gayle Saddie JULIANNA DEVONNA as PCP - General (Physician Assistant)   Outpatient Medications Prior to Visit  Medication Sig   atorvastatin  (LIPITOR) 20 MG tablet Take 1 tablet by mouth once daily   fenofibrate  (TRICOR ) 48 MG tablet Take 1 tablet by mouth once daily   gabapentin  (NEURONTIN ) 300 MG capsule Take 1 capsule (300 mg total) by mouth at bedtime.   guaiFENesin  (MUCINEX ) 600 MG 12 hr tablet Take 1 tablet (600 mg total) by mouth 2 (two) times daily.   lisinopril  (ZESTRIL ) 20 MG tablet Take 1 tablet by mouth once daily   metoprolol  succinate (TOPROL -XL) 25 MG 24 hr tablet Take 1 tablet by mouth once daily   Omega-3 Fatty Acids (FISH OIL) 1000 MG CAPS Take 2,000 mg by mouth daily.   omeprazole  (PRILOSEC) 20 MG capsule Take 1 capsule by mouth once daily   TADALAFIL PO Take by mouth.    testosterone  cypionate (DEPOTESTOSTERONE CYPIONATE) 200 MG/ML injection Inject 0.85 mg into the muscle once a week.   [DISCONTINUED] lidocaine  4 % Place 1 patch onto the skin daily.   [DISCONTINUED] Vitamin D , Ergocalciferol , (DRISDOL ) 1.25 MG (50000 UNIT) CAPS capsule Take 1 capsule (50,000 Units total) by mouth every 7 (seven) days.   albuterol (VENTOLIN HFA) 108 (90 Base) MCG/ACT inhaler Inhale 2 puffs into the lungs every 6 (six) hours as needed.   No facility-administered medications prior to visit.    ROS  Per HPI      Objective:     BP 118/77   Pulse 96   Temp 98 F (36.7 C) (Oral)   Ht 5' 4 (1.626 m)   Wt 155 lb 1.9 oz (70.4 kg)   SpO2 96%   BMI 26.63 kg/m    Physical Exam Constitutional:      General: He is not in acute distress.    Appearance: Normal appearance.  HENT:     Right Ear: Tympanic membrane normal.     Left Ear: Tympanic membrane normal.     Mouth/Throat:     Mouth: Mucous membranes are moist.     Pharynx: Oropharynx is clear.  Eyes:  Pupils: Pupils are equal, round, and reactive to light.  Cardiovascular:     Rate and Rhythm: Normal rate and regular rhythm.     Heart sounds: Normal heart sounds. No murmur heard.    No friction rub. No gallop.  Pulmonary:     Effort: Pulmonary effort is normal. No respiratory distress.     Breath sounds: Normal breath sounds.  Abdominal:     General: Abdomen is flat. Bowel sounds are normal.     Palpations: Abdomen is soft.  Musculoskeletal:        General: No swelling.     Cervical back: Neck supple.  Lymphadenopathy:     Cervical: No cervical adenopathy.  Skin:    General: Skin is warm and dry.  Neurological:     General: No focal deficit present.     Mental Status: He is alert.  Psychiatric:        Mood and Affect: Mood normal.        Behavior: Behavior normal.        Thought Content: Thought content normal.     No results found for any visits on 08/04/23. Last CBC Lab Results  Component  Value Date   WBC 13.2 (H) 07/22/2023   HGB 15.3 07/22/2023   HCT 47.9 07/22/2023   MCV 89 07/22/2023   MCH 28.5 07/22/2023   RDW 16.6 (H) 07/22/2023   PLT 257 07/22/2023   Last metabolic panel Lab Results  Component Value Date   GLUCOSE 113 (H) 07/22/2023   NA 134 07/22/2023   K 4.6 07/22/2023   CL 96 07/22/2023   CO2 21 07/22/2023   BUN 11 07/22/2023   CREATININE 1.60 (H) 07/22/2023   EGFR 50 (L) 07/22/2023   CALCIUM  9.6 07/22/2023   PROT 7.0 07/22/2023   ALBUMIN 4.1 07/22/2023   LABGLOB 2.9 07/22/2023   AGRATIO 1.6 05/30/2022   BILITOT 0.6 07/22/2023   ALKPHOS 104 07/22/2023   AST 21 07/22/2023   ALT 11 07/22/2023   ANIONGAP 9 08/09/2020   Last lipids Lab Results  Component Value Date   CHOL 169 07/22/2023   HDL 25 (L) 07/22/2023   LDLCALC 89 07/22/2023   TRIG 330 (H) 07/22/2023   CHOLHDL 6.8 (H) 07/22/2023   Last hemoglobin A1c Lab Results  Component Value Date   HGBA1C 5.6 07/22/2023   Last thyroid  functions Lab Results  Component Value Date   TSH 2.850 07/22/2023   Last vitamin D  Lab Results  Component Value Date   VD25OH 29.5 (L) 07/22/2023        Assessment & Plan:    Routine Health Maintenance and Physical Exam  Immunization History  Administered Date(s) Administered   Tdap 08/29/2014    Health Maintenance  Topic Date Due   Colonoscopy  10/06/2022   Lung Cancer Screening  07/12/2023   COVID-19 Vaccine (1 - 2024-25 season) 08/20/2023 (Originally 09/15/2022)   Zoster Vaccines- Shingrix (1 of 2) 11/04/2023 (Originally 06/26/2017)   Pneumococcal Vaccine 72-57 Years old (1 of 2 - PCV) 08/03/2024 (Originally 06/27/1986)   Hepatitis B Vaccines (1 of 3 - 19+ 3-dose series) 08/03/2024 (Originally 06/27/1986)   INFLUENZA VACCINE  08/15/2023   DTaP/Tdap/Td (2 - Td or Tdap) 08/28/2024   Hepatitis C Screening  Completed   HIV Screening  Completed   HPV VACCINES  Aged Out   Meningococcal B Vaccine  Aged Out    Discussed health benefits of  physical activity, and encouraged him to engage in regular exercise appropriate for  his age and condition.  Problem List Items Addressed This Visit       Cardiovascular and Mediastinum   HTN (hypertension) (Chronic)   BP goal <130/80.  Blood pressure stable and at goal today.  Continue lisinopril  20 mg daily, metoprolol  25 mg daily.  Kidney function suboptimal with a creatinine of 1.6 and GFR of 50.  Unchanged from the last time it was checked 8 months ago.  Patient would like to go through with ultrasound of bilateral kidneys to rule out causes of his decreased kidney function given that his blood pressure is well-controlled and he is not diabetic.  Will continue to monitor.        Other   Tobacco use disorder   Currently smoking 1.5 packs/day.  Working to reduce that by replacing cigarettes with nicotine pouches (Zyn).  Encouraged him to continue his efforts toward smoking cessation.  Patient not currently interested in medication assistance/other forms of nicotine replacement at this time.  Encouraged him to call his insurance to see if Chantix would be covered.  Will get screening ordered today.  Will continue to monitor.      HLD (hyperlipidemia)   Last lipid panel: LDL 89, HDL 25, triglycerides 330.  Liver function within normal limits.  Continue atorvastatin  40 mg daily and fenofibrate  40 mg daily.  Advised patient that we can plan to recheck this blood work in 6 months and if triglycerides are still significantly elevated, we can consider increasing his fenofibrate  dose.  Will continue to monitor.      Elevated serum creatinine   Hypertension is well-controlled and patient is not diabetic.  Given that EGFR/creatinine has been suboptimal for over a year, will obtain ultrasound of bilateral kidneys to rule out other causes.      Relevant Orders   US  RENAL   Diarrhea   Will obtain ova/stool parasite panel.  Patient provided with sterile cup to bring in stool sample at his convenience.   If positive, will treat accordingly.  Colonoscopy ordered today.  Given patient's elevated white count at the time labs were drawn, suspect acute GI illness versus parasite in stool.  Will continue to monitor.      Relevant Orders   OVA + PARASITE EXAM   Vitamin D  deficiency   Most recent vitamin D  21.5.  Start vitamin D  3 supplement of 2000 IU daily.      Other Visit Diagnoses       Screening for colorectal cancer    -  Primary   Relevant Orders   Ambulatory referral to Gastroenterology     Screening for lung cancer       Relevant Orders   CT CHEST LUNG CA SCREEN LOW DOSE W/O CM      Return in about 6 months (around 02/04/2024) for HTN, HLD, smoking, .     Saddie JULIANNA Sacks, PA-C

## 2023-08-04 NOTE — Assessment & Plan Note (Signed)
 Will obtain ova/stool parasite panel.  Patient provided with sterile cup to bring in stool sample at his convenience.  If positive, will treat accordingly.  Colonoscopy ordered today.  Given patient's elevated white count at the time labs were drawn, suspect acute GI illness versus parasite in stool.  Will continue to monitor.

## 2023-08-04 NOTE — Assessment & Plan Note (Signed)
 BP goal <130/80.  Blood pressure stable and at goal today.  Continue lisinopril  20 mg daily, metoprolol  25 mg daily.  Kidney function suboptimal with a creatinine of 1.6 and GFR of 50.  Unchanged from the last time it was checked 8 months ago.  Patient would like to go through with ultrasound of bilateral kidneys to rule out causes of his decreased kidney function given that his blood pressure is well-controlled and he is not diabetic.  Will continue to monitor.

## 2023-08-04 NOTE — Assessment & Plan Note (Signed)
 Most recent vitamin D  21.5.  Start vitamin D  3 supplement of 2000 IU daily.

## 2023-08-04 NOTE — Assessment & Plan Note (Signed)
 Currently smoking 1.5 packs/day.  Working to reduce that by replacing cigarettes with nicotine pouches (Zyn).  Encouraged him to continue his efforts toward smoking cessation.  Patient not currently interested in medication assistance/other forms of nicotine replacement at this time.  Encouraged him to call his insurance to see if Chantix would be covered.  Will get screening ordered today.  Will continue to monitor.

## 2023-08-04 NOTE — Assessment & Plan Note (Signed)
 Last lipid panel: LDL 89, HDL 25, triglycerides 330.  Liver function within normal limits.  Continue atorvastatin  40 mg daily and fenofibrate  40 mg daily.  Advised patient that we can plan to recheck this blood work in 6 months and if triglycerides are still significantly elevated, we can consider increasing his fenofibrate  dose.  Will continue to monitor.

## 2023-08-04 NOTE — Patient Instructions (Signed)
 It was nice to see you today!  As we discussed in clinic:  -Continue all of your current medications as prescribed. We will follow up together in 6 months and recheck cholesterol. If we need to, we may need to increase the fenofibrate  in the future to ensure triglycerides improve  -I have placed the referral for ultrasound of kidneys and your lung cancer screening. [facility] will call you to schedule these.  -I have also placed the referral for colonoscopy.  -Refills sent to [facility] on [address].  -Please contact your insurance company when you have time to inquire about which smoking cessation medications are covered and we can discuss our options based on what is covered. -We have sent you home with a stool sample cup. Please provide a stool sample and bring it back to our lab to be tested. If positive for anything, we will treat it accordingly.   I will plan to see you back in 6 months for a follow up with labs the week before! It was nice to meet you!   If you have any problems before your next visit feel free to message me via MyChart (minor issues or questions) or call the office, otherwise you may reach out to schedule an office visit.  Thank you! Saddie Sacks, PA-C

## 2023-08-11 ENCOUNTER — Ambulatory Visit
Admission: RE | Admit: 2023-08-11 | Discharge: 2023-08-11 | Disposition: A | Discharge status: Home / Self Care | Source: Ambulatory Visit

## 2023-08-11 DIAGNOSIS — R7989 Other specified abnormal findings of blood chemistry: Secondary | ICD-10-CM

## 2023-08-11 DIAGNOSIS — Z122 Encounter for screening for malignant neoplasm of respiratory organs: Secondary | ICD-10-CM

## 2023-08-12 ENCOUNTER — Other Ambulatory Visit: Payer: Self-pay

## 2023-08-12 DIAGNOSIS — E782 Mixed hyperlipidemia: Secondary | ICD-10-CM

## 2023-08-14 ENCOUNTER — Other Ambulatory Visit

## 2023-08-18 ENCOUNTER — Ambulatory Visit: Payer: Self-pay

## 2023-08-18 DIAGNOSIS — R7989 Other specified abnormal findings of blood chemistry: Secondary | ICD-10-CM

## 2023-08-23 ENCOUNTER — Other Ambulatory Visit: Payer: Self-pay | Admitting: Family Medicine

## 2023-08-23 DIAGNOSIS — K219 Gastro-esophageal reflux disease without esophagitis: Secondary | ICD-10-CM

## 2023-08-28 ENCOUNTER — Other Ambulatory Visit: Payer: Self-pay

## 2023-08-28 DIAGNOSIS — R197 Diarrhea, unspecified: Secondary | ICD-10-CM

## 2023-08-29 ENCOUNTER — Other Ambulatory Visit

## 2023-08-29 ENCOUNTER — Other Ambulatory Visit: Payer: Self-pay

## 2023-08-29 DIAGNOSIS — R197 Diarrhea, unspecified: Secondary | ICD-10-CM

## 2023-08-30 LAB — URINALYSIS, ROUTINE W REFLEX MICROSCOPIC
Bilirubin, UA: NEGATIVE
Glucose, UA: NEGATIVE
Ketones, UA: NEGATIVE
Leukocytes,UA: NEGATIVE
Nitrite, UA: NEGATIVE
RBC, UA: NEGATIVE
Specific Gravity, UA: 1.013 (ref 1.005–1.030)
Urobilinogen, Ur: 0.2 mg/dL (ref 0.2–1.0)
pH, UA: 7.5 (ref 5.0–7.5)

## 2023-08-30 LAB — MICROALBUMIN / CREATININE URINE RATIO
Creatinine, Urine: 90.7 mg/dL
Microalb/Creat Ratio: 16 mg/g{creat} (ref 0–29)
Microalbumin, Urine: 14.5 ug/mL

## 2023-09-01 ENCOUNTER — Ambulatory Visit: Payer: Self-pay

## 2023-09-13 ENCOUNTER — Other Ambulatory Visit: Payer: Self-pay | Admitting: Family Medicine

## 2023-09-13 DIAGNOSIS — I10 Essential (primary) hypertension: Secondary | ICD-10-CM

## 2023-09-17 ENCOUNTER — Other Ambulatory Visit: Payer: Self-pay

## 2023-09-17 DIAGNOSIS — E782 Mixed hyperlipidemia: Secondary | ICD-10-CM

## 2023-10-15 ENCOUNTER — Other Ambulatory Visit: Payer: Self-pay

## 2023-10-15 DIAGNOSIS — E782 Mixed hyperlipidemia: Secondary | ICD-10-CM

## 2023-11-16 ENCOUNTER — Other Ambulatory Visit: Payer: Self-pay

## 2023-11-16 DIAGNOSIS — E782 Mixed hyperlipidemia: Secondary | ICD-10-CM

## 2023-11-20 ENCOUNTER — Other Ambulatory Visit: Payer: Self-pay

## 2023-11-20 DIAGNOSIS — E782 Mixed hyperlipidemia: Secondary | ICD-10-CM

## 2023-12-14 ENCOUNTER — Other Ambulatory Visit: Payer: Self-pay

## 2023-12-14 DIAGNOSIS — K219 Gastro-esophageal reflux disease without esophagitis: Secondary | ICD-10-CM

## 2023-12-14 DIAGNOSIS — I10 Essential (primary) hypertension: Secondary | ICD-10-CM

## 2024-01-09 ENCOUNTER — Other Ambulatory Visit: Payer: Self-pay

## 2024-01-09 DIAGNOSIS — E782 Mixed hyperlipidemia: Secondary | ICD-10-CM

## 2024-01-23 ENCOUNTER — Other Ambulatory Visit: Payer: Self-pay

## 2024-01-23 DIAGNOSIS — E785 Hyperlipidemia, unspecified: Secondary | ICD-10-CM

## 2024-01-23 DIAGNOSIS — I10 Essential (primary) hypertension: Secondary | ICD-10-CM

## 2024-01-23 DIAGNOSIS — E663 Overweight: Secondary | ICD-10-CM

## 2024-01-23 DIAGNOSIS — E559 Vitamin D deficiency, unspecified: Secondary | ICD-10-CM

## 2024-01-28 ENCOUNTER — Other Ambulatory Visit

## 2024-01-28 DIAGNOSIS — E785 Hyperlipidemia, unspecified: Secondary | ICD-10-CM

## 2024-01-28 DIAGNOSIS — I10 Essential (primary) hypertension: Secondary | ICD-10-CM

## 2024-01-28 DIAGNOSIS — E559 Vitamin D deficiency, unspecified: Secondary | ICD-10-CM

## 2024-01-28 DIAGNOSIS — E663 Overweight: Secondary | ICD-10-CM

## 2024-01-29 ENCOUNTER — Ambulatory Visit: Payer: Self-pay

## 2024-01-29 LAB — TSH: TSH: 3.95 u[IU]/mL (ref 0.450–4.500)

## 2024-01-29 LAB — COMPREHENSIVE METABOLIC PANEL WITH GFR
ALT: 11 IU/L (ref 0–44)
AST: 20 IU/L (ref 0–40)
Albumin: 4 g/dL (ref 3.8–4.9)
Alkaline Phosphatase: 96 IU/L (ref 47–123)
BUN/Creatinine Ratio: 4 — ABNORMAL LOW (ref 9–20)
BUN: 8 mg/dL (ref 6–24)
Bilirubin Total: 0.7 mg/dL (ref 0.0–1.2)
CO2: 25 mmol/L (ref 20–29)
Calcium: 9.1 mg/dL (ref 8.7–10.2)
Chloride: 98 mmol/L (ref 96–106)
Creatinine, Ser: 2.03 mg/dL — ABNORMAL HIGH (ref 0.76–1.27)
Globulin, Total: 2.8 g/dL (ref 1.5–4.5)
Glucose: 121 mg/dL — ABNORMAL HIGH (ref 70–99)
Potassium: 4.5 mmol/L (ref 3.5–5.2)
Sodium: 136 mmol/L (ref 134–144)
Total Protein: 6.8 g/dL (ref 6.0–8.5)
eGFR: 38 mL/min/1.73 — ABNORMAL LOW

## 2024-01-29 LAB — CBC WITH DIFFERENTIAL/PLATELET
Basophils Absolute: 0.1 x10E3/uL (ref 0.0–0.2)
Basos: 1 %
EOS (ABSOLUTE): 0.2 x10E3/uL (ref 0.0–0.4)
Eos: 3 %
Hematocrit: 49 % (ref 37.5–51.0)
Hemoglobin: 15.3 g/dL (ref 13.0–17.7)
Immature Grans (Abs): 0 x10E3/uL (ref 0.0–0.1)
Immature Granulocytes: 0 %
Lymphocytes Absolute: 2.5 x10E3/uL (ref 0.7–3.1)
Lymphs: 35 %
MCH: 27.7 pg (ref 26.6–33.0)
MCHC: 31.2 g/dL — ABNORMAL LOW (ref 31.5–35.7)
MCV: 89 fL (ref 79–97)
Monocytes Absolute: 0.3 x10E3/uL (ref 0.1–0.9)
Monocytes: 4 %
Neutrophils Absolute: 4 x10E3/uL (ref 1.4–7.0)
Neutrophils: 57 %
Platelets: 257 x10E3/uL (ref 150–450)
RBC: 5.53 x10E6/uL (ref 4.14–5.80)
RDW: 17 % — ABNORMAL HIGH (ref 11.6–15.4)
WBC: 7 x10E3/uL (ref 3.4–10.8)

## 2024-01-29 LAB — LIPID PANEL
Chol/HDL Ratio: 6.3 ratio — ABNORMAL HIGH (ref 0.0–5.0)
Cholesterol, Total: 171 mg/dL (ref 100–199)
HDL: 27 mg/dL — ABNORMAL LOW
LDL Chol Calc (NIH): 106 mg/dL — ABNORMAL HIGH (ref 0–99)
Triglycerides: 218 mg/dL — ABNORMAL HIGH (ref 0–149)
VLDL Cholesterol Cal: 38 mg/dL (ref 5–40)

## 2024-01-29 LAB — HEMOGLOBIN A1C
Est. average glucose Bld gHb Est-mCnc: 120 mg/dL
Hgb A1c MFr Bld: 5.8 % — ABNORMAL HIGH (ref 4.8–5.6)

## 2024-01-29 LAB — VITAMIN D 25 HYDROXY (VIT D DEFICIENCY, FRACTURES): Vit D, 25-Hydroxy: 20.7 ng/mL — ABNORMAL LOW (ref 30.0–100.0)

## 2024-02-04 ENCOUNTER — Ambulatory Visit

## 2024-02-04 VITALS — BP 123/84 | HR 98 | Ht 64.0 in | Wt 161.0 lb

## 2024-02-04 DIAGNOSIS — M542 Cervicalgia: Secondary | ICD-10-CM

## 2024-02-04 DIAGNOSIS — N183 Chronic kidney disease, stage 3 unspecified: Secondary | ICD-10-CM

## 2024-02-04 DIAGNOSIS — N529 Male erectile dysfunction, unspecified: Secondary | ICD-10-CM | POA: Diagnosis not present

## 2024-02-04 DIAGNOSIS — I10 Essential (primary) hypertension: Secondary | ICD-10-CM | POA: Diagnosis not present

## 2024-02-04 DIAGNOSIS — E782 Mixed hyperlipidemia: Secondary | ICD-10-CM | POA: Diagnosis not present

## 2024-02-04 DIAGNOSIS — L719 Rosacea, unspecified: Secondary | ICD-10-CM

## 2024-02-04 DIAGNOSIS — M109 Gout, unspecified: Secondary | ICD-10-CM

## 2024-02-04 DIAGNOSIS — Z125 Encounter for screening for malignant neoplasm of prostate: Secondary | ICD-10-CM | POA: Diagnosis not present

## 2024-02-04 DIAGNOSIS — R7989 Other specified abnormal findings of blood chemistry: Secondary | ICD-10-CM

## 2024-02-04 DIAGNOSIS — G8929 Other chronic pain: Secondary | ICD-10-CM

## 2024-02-04 MED ORDER — METRONIDAZOLE 1 % EX GEL: Freq: Every day | CUTANEOUS | 2 refills | Status: AC

## 2024-02-04 MED ORDER — DICLOFENAC SODIUM 75 MG PO TBEC: 75.0000 mg | DELAYED_RELEASE_TABLET | Freq: Two times a day (BID) | ORAL | 0 refills | Status: AC

## 2024-02-04 MED ORDER — GABAPENTIN 300 MG PO CAPS: 300.0000 mg | ORAL_CAPSULE | Freq: Every day | ORAL | 1 refills | Status: AC

## 2024-02-04 MED ORDER — TADALAFIL 20 MG PO TABS: 20.0000 mg | ORAL_TABLET | Freq: Every day | ORAL | 2 refills | Status: AC | PRN

## 2024-02-04 NOTE — Patient Instructions (Addendum)
 VISIT SUMMARY: During your visit, we addressed your gout symptoms, neck pain, erectile dysfunction, rosacea, and other health concerns. We provided medications and recommendations to manage these conditions.  YOUR PLAN: GOUT: You have experienced intermittent pain and swelling in your toes, which is suspected to be gout. -We prescribed colchicine for acute gout flares. -We refilled your gabapentin  for pain management. -Please contact us  if symptoms persist for uric acid testing and preventative treatment discussion.  CHRONIC NECK PAIN: Your neck pain has been managed effectively with gabapentin . -We refilled your gabapentin  for pain management.  ROSACEA: You requested a refill of Metrogel  for your rosacea, which you use primarily in the winter. -We prescribed Metrogel  with refills.  ERECTILE DYSFUNCTION: You requested an increase in your tadalafil  dosage for erectile dysfunction. -We prescribed tadalafil  20 mg.  CHRONIC KIDNEY DISEASE: Your recent labs show kidney function slightly off baseline. -Increase your water intake. -We will recheck your kidney function in two weeks.  HYPERLIPIDEMIA: Your recent labs show improvement in triglycerides, but LDL is slightly elevated. -We will recheck your cholesterol levels in three months.  VITAMIN D  DEFICIENCY: Your vitamin D  levels are slightly low. -Continue taking over-the-counter vitamin D  supplements.  PROSTATE CANCER SCREENING: You are due for a prostate cancer screening, especially since you are on testosterone  therapy. -We ordered a prostate cancer screening blood test.  COLORECTAL CANCER SCREENING: You are due for a colorectal cancer screening. -We provided you with the gastroenterology contact for colonoscopy scheduling.  If you have any problems before your next visit feel free to message me via MyChart (minor issues or questions) or call the office, otherwise you may reach out to schedule an office visit.  Thank you! Saddie Sacks, PA-C

## 2024-02-08 DIAGNOSIS — N183 Chronic kidney disease, stage 3 unspecified: Secondary | ICD-10-CM | POA: Insufficient documentation

## 2024-02-08 DIAGNOSIS — M109 Gout, unspecified: Secondary | ICD-10-CM | POA: Insufficient documentation

## 2024-02-08 DIAGNOSIS — G8929 Other chronic pain: Secondary | ICD-10-CM | POA: Insufficient documentation

## 2024-02-08 DIAGNOSIS — N529 Male erectile dysfunction, unspecified: Secondary | ICD-10-CM | POA: Insufficient documentation

## 2024-02-08 DIAGNOSIS — L719 Rosacea, unspecified: Secondary | ICD-10-CM | POA: Insufficient documentation

## 2024-02-08 NOTE — Assessment & Plan Note (Signed)
 Managed with MetroGel  1%, primarily needed in the winter.  Current supply expired. - Refilled MetroGel 

## 2024-02-08 NOTE — Assessment & Plan Note (Signed)
 Chronic neck pain treated with gabapentin  300 mg once a day.  While this is renally dosed for his chronic kidney disease, if GFR continues to worsen, may need to consider replacing with a different agent such as tramadol.

## 2024-02-08 NOTE — Assessment & Plan Note (Signed)
 Intermittent gout flares, primarily in toes.  Resolved with gabapentin  and oral anti-inflammatory that he had at home (diclofenac ).  No current symptoms today in office. Refilled gabapentin  Refill diclofenac  Advised to contact office during next flare so we can check uric acid levels to confirm diagnosis.  If diclofenac  fails to improve next flare, we discussed colchicine as a more studied option. If symptoms become more frequent, we also discussed the potential of starting allopurinol.  Will continue to monitor.

## 2024-02-08 NOTE — Progress Notes (Signed)
 "  Established Patient Office Visit  Subjective   Patient ID: Walter Snow, male    DOB: [birth year]  Age: 59 y.o. MRN: [id number]  Chief Complaint  Patient presents with   Medical Management of Chronic Issues    HPI   History of Present Illness   Walter Snow is a 59 year old male who presents with symptoms suggestive of gout.  Articular pain and swelling - Intermittent pain and swelling affecting the toes for the past few months - Suspects gout as the etiology based on symptom pattern and personal research - Initial self-care measures included soaking feet in peroxide and Epsom salts without relief - Self-medicated with gabapentin  (prescribed for neck pain) and an anti-inflammatory medication provided by a friend, resulting in significant symptom relief by the next morning - Symptoms have not recurred with the same severity since self-medication - Requests medication for future gout flares   Neck pain - Currently takes gabapentin  300 mg capsules for neck pain - Neck pain has improved with gabapentin   Erectile dysfunction - Requests refill of tadalafil  - Currently takes tadalafil  10 mg, desires to increase dose to 20 mg  Rosacea - Requests refill of Metrogel  for rosacea, which he typically uses in the winter - Current tube is several years old and has changed color, indicating possible expiration          ROS Per HPI.    Objective:     BP 123/84   Pulse 98   Ht 5' 4 (1.626 m)   Wt 161 lb (73 kg)   SpO2 95%   BMI 27.64 kg/m    Physical Exam Constitutional:      General: He is not in acute distress.    Appearance: Normal appearance.  Cardiovascular:     Rate and Rhythm: Normal rate and regular rhythm.     Heart sounds: Normal heart sounds. No murmur heard.    No friction rub. No gallop.  Pulmonary:     Effort: Pulmonary effort is normal. No respiratory distress.     Breath sounds: Normal breath sounds.  Musculoskeletal:        General: No  swelling.  Lymphadenopathy:     Cervical: No cervical adenopathy.  Skin:    General: Skin is warm and dry.     Comments: Redness and small pustules noted to cheeks and nose.  Neurological:     General: No focal deficit present.     Mental Status: He is alert.  Psychiatric:        Mood and Affect: Mood normal.        Behavior: Behavior normal.        Thought Content: Thought content normal.      No results found for any visits on 02/04/24.  Last CBC Lab Results  Component Value Date   WBC 7.0 01/28/2024   HGB 15.3 01/28/2024   HCT 49.0 01/28/2024   MCV 89 01/28/2024   MCH 27.7 01/28/2024   RDW 17.0 (H) 01/28/2024   PLT 257 01/28/2024   Last metabolic panel Lab Results  Component Value Date   GLUCOSE 121 (H) 01/28/2024   NA 136 01/28/2024   K 4.5 01/28/2024   CL 98 01/28/2024   CO2 25 01/28/2024   BUN 8 01/28/2024   CREATININE 2.03 (H) 01/28/2024   EGFR 38 (L) 01/28/2024   CALCIUM  9.1 01/28/2024   PROT 6.8 01/28/2024   ALBUMIN 4.0 01/28/2024   LABGLOB 2.8 01/28/2024   AGRATIO 1.6 05/30/2022  BILITOT 0.7 01/28/2024   ALKPHOS 96 01/28/2024   AST 20 01/28/2024   ALT 11 01/28/2024   ANIONGAP 9 08/09/2020   Last lipids Lab Results  Component Value Date   CHOL 171 01/28/2024   HDL 27 (L) 01/28/2024   LDLCALC 106 (H) 01/28/2024   TRIG 218 (H) 01/28/2024   CHOLHDL 6.3 (H) 01/28/2024   Last hemoglobin A1c Lab Results  Component Value Date   HGBA1C 5.8 (H) 01/28/2024   Last thyroid  functions Lab Results  Component Value Date   TSH 3.950 01/28/2024   FREET4 0.63 05/17/2015   Last vitamin D  Lab Results  Component Value Date   VD25OH 20.7 (L) 01/28/2024      The 10-year ASCVD risk score (Arnett DK, et al., 2019) is: 16.7%    Assessment & Plan:   Screening for prostate cancer -     PSA  Neck pain, chronic -     Gabapentin ; Take 1 capsule (300 mg total) by mouth at bedtime.  Dispense: 90 capsule; Refill: 1  Elevated serum creatinine -      Comprehensive metabolic panel with GFR  Acute gout involving toe, unspecified cause, unspecified laterality Assessment & Plan: Intermittent gout flares, primarily in toes.  Resolved with gabapentin  and oral anti-inflammatory that he had at home (diclofenac ).  No current symptoms today in office. Refilled gabapentin  Refill diclofenac  Advised to contact office during next flare so we can check uric acid levels to confirm diagnosis.  If diclofenac  fails to improve next flare, we discussed colchicine as a more studied option. If symptoms become more frequent, we also discussed the potential of starting allopurinol.  Will continue to monitor.   Primary hypertension Assessment & Plan: BP goal <130/80.  Blood pressure stable and at goal today.  Continue lisinopril  20 mg daily, metoprolol  25 mg daily.  Recent creatinine was 2.03 and eGFR 38.  Discussed that we will need to monitor this closely.  If eGFR continues to decrease, will need to discontinue lisinopril  as well as gabapentin .   Mixed hyperlipidemia Assessment & Plan: Last lipid panel: LDL 106, triglycerides 218, HDL 27. The 10-year ASCVD risk score (Arnett DK, et al., 2019) is: 16.7% triglycerides have improved significantly over the last 6 months. Patient would like to work on diet for 3 months before increasing cholesterol medicine.  Continue atorvastatin  20 mg daily.  Continue fenofibrate  48 mg daily.  At some point, we will likely need to increase both of these medications to optimize cholesterol and lower cardiovascular risk.  Risks and benefits of delaying medication changes discussed.     Stage 3 chronic kidney disease, unspecified whether stage 3a or 3b CKD (HCC) Assessment & Plan: Most recent creatinine: 2.03, eGFR: 38.  This is below his baseline.  He is optimized on lisinopril .  Advised hydration and will recheck kidney function in 2 weeks to ensure that this was just a transient drop.  Patient may also benefit from the addition  of low-dose Jardiance for renal protection.  If eGFR drops any closer to 30, we will need to discontinue lisinopril  as it would likely be doing more harm than good at that point.  Depending on what repeat lab work shows, consider referral to nephrology.   Rosacea Assessment & Plan: Managed with MetroGel  1%, primarily needed in the winter.  Current supply expired. - Refilled MetroGel    Erectile dysfunction, unspecified erectile dysfunction type Assessment & Plan: Managed with tadalafil  20 mg as needed.  Refill sent today.   Chronic neck  pain Assessment & Plan: Chronic neck pain treated with gabapentin  300 mg once a day.  While this is renally dosed for his chronic kidney disease, if GFR continues to worsen, may need to consider replacing with a different agent such as tramadol.   Other orders -     Diclofenac  Sodium; Take 1 tablet (75 mg total) by mouth 2 (two) times daily.  Dispense: 30 tablet; Refill: 0 -     Tadalafil ; Take 1 tablet (20 mg total) by mouth daily as needed.  Dispense: 10 tablet; Refill: 2 -     metroNIDAZOLE ; Apply topically daily.  Dispense: 60 g; Refill: 2    Return in about 4 months (around 06/03/2024) for Med check.    Saddie JULIANNA Sacks, PA-C "

## 2024-02-08 NOTE — Assessment & Plan Note (Signed)
 Most recent creatinine: 2.03, eGFR: 38.  This is below his baseline.  He is optimized on lisinopril .  Advised hydration and will recheck kidney function in 2 weeks to ensure that this was just a transient drop.  Patient may also benefit from the addition of low-dose Jardiance for renal protection.  If eGFR drops any closer to 30, we will need to discontinue lisinopril  as it would likely be doing more harm than good at that point.  Depending on what repeat lab work shows, consider referral to nephrology.

## 2024-02-08 NOTE — Assessment & Plan Note (Signed)
 Managed with tadalafil  20 mg as needed.  Refill sent today.

## 2024-02-08 NOTE — Assessment & Plan Note (Signed)
 BP goal <130/80.  Blood pressure stable and at goal today.  Continue lisinopril  20 mg daily, metoprolol  25 mg daily.  Recent creatinine was 2.03 and eGFR 38.  Discussed that we will need to monitor this closely.  If eGFR continues to decrease, will need to discontinue lisinopril  as well as gabapentin .

## 2024-02-08 NOTE — Assessment & Plan Note (Signed)
 Last lipid panel: LDL 106, triglycerides 218, HDL 27. The 10-year ASCVD risk score (Arnett DK, et al., 2019) is: 16.7% triglycerides have improved significantly over the last 6 months. Patient would like to work on diet for 3 months before increasing cholesterol medicine.  Continue atorvastatin  20 mg daily.  Continue fenofibrate  48 mg daily.  At some point, we will likely need to increase both of these medications to optimize cholesterol and lower cardiovascular risk.  Risks and benefits of delaying medication changes discussed.

## 2024-02-11 ENCOUNTER — Other Ambulatory Visit: Payer: Self-pay

## 2024-02-11 DIAGNOSIS — E782 Mixed hyperlipidemia: Secondary | ICD-10-CM

## 2024-02-12 NOTE — Addendum Note (Signed)
 Addended by: ZIMMERMAN RUMPLE, Katharyn Schauer D on: 02/12/2024 04:02 PM   Modules accepted: Orders

## 2024-02-18 ENCOUNTER — Other Ambulatory Visit

## 2024-02-18 DIAGNOSIS — R7989 Other specified abnormal findings of blood chemistry: Secondary | ICD-10-CM

## 2024-02-18 DIAGNOSIS — Z125 Encounter for screening for malignant neoplasm of prostate: Secondary | ICD-10-CM

## 2024-02-19 LAB — COMPREHENSIVE METABOLIC PANEL WITH GFR
ALT: 12 [IU]/L (ref 0–44)
AST: 20 [IU]/L (ref 0–40)
Albumin: 4.1 g/dL (ref 3.8–4.9)
Alkaline Phosphatase: 94 [IU]/L (ref 47–123)
BUN/Creatinine Ratio: 4 — ABNORMAL LOW (ref 9–20)
BUN: 7 mg/dL (ref 6–24)
Bilirubin Total: 0.4 mg/dL (ref 0.0–1.2)
CO2: 23 mmol/L (ref 20–29)
Calcium: 9.8 mg/dL (ref 8.7–10.2)
Chloride: 95 mmol/L — ABNORMAL LOW (ref 96–106)
Creatinine, Ser: 1.56 mg/dL — ABNORMAL HIGH (ref 0.76–1.27)
Globulin, Total: 2.8 g/dL (ref 1.5–4.5)
Glucose: 95 mg/dL (ref 70–99)
Potassium: 5 mmol/L (ref 3.5–5.2)
Sodium: 134 mmol/L (ref 134–144)
Total Protein: 6.9 g/dL (ref 6.0–8.5)
eGFR: 52 mL/min/{1.73_m2} — ABNORMAL LOW

## 2024-02-19 LAB — PSA: Prostate Specific Ag, Serum: 1 ng/mL (ref 0.0–4.0)

## 2024-05-07 IMAGING — DX DG SHOULDER 2+V*L*
3 series · 3 of 3 positions shown · non-contrast
Comparison: Chest radiograph dated June 09, 2020

CLINICAL DATA: bilateral shoulder pain

EXAM:
LEFT SHOULDER - 2+ VIEW

[shoulder ap (1 of 2)]
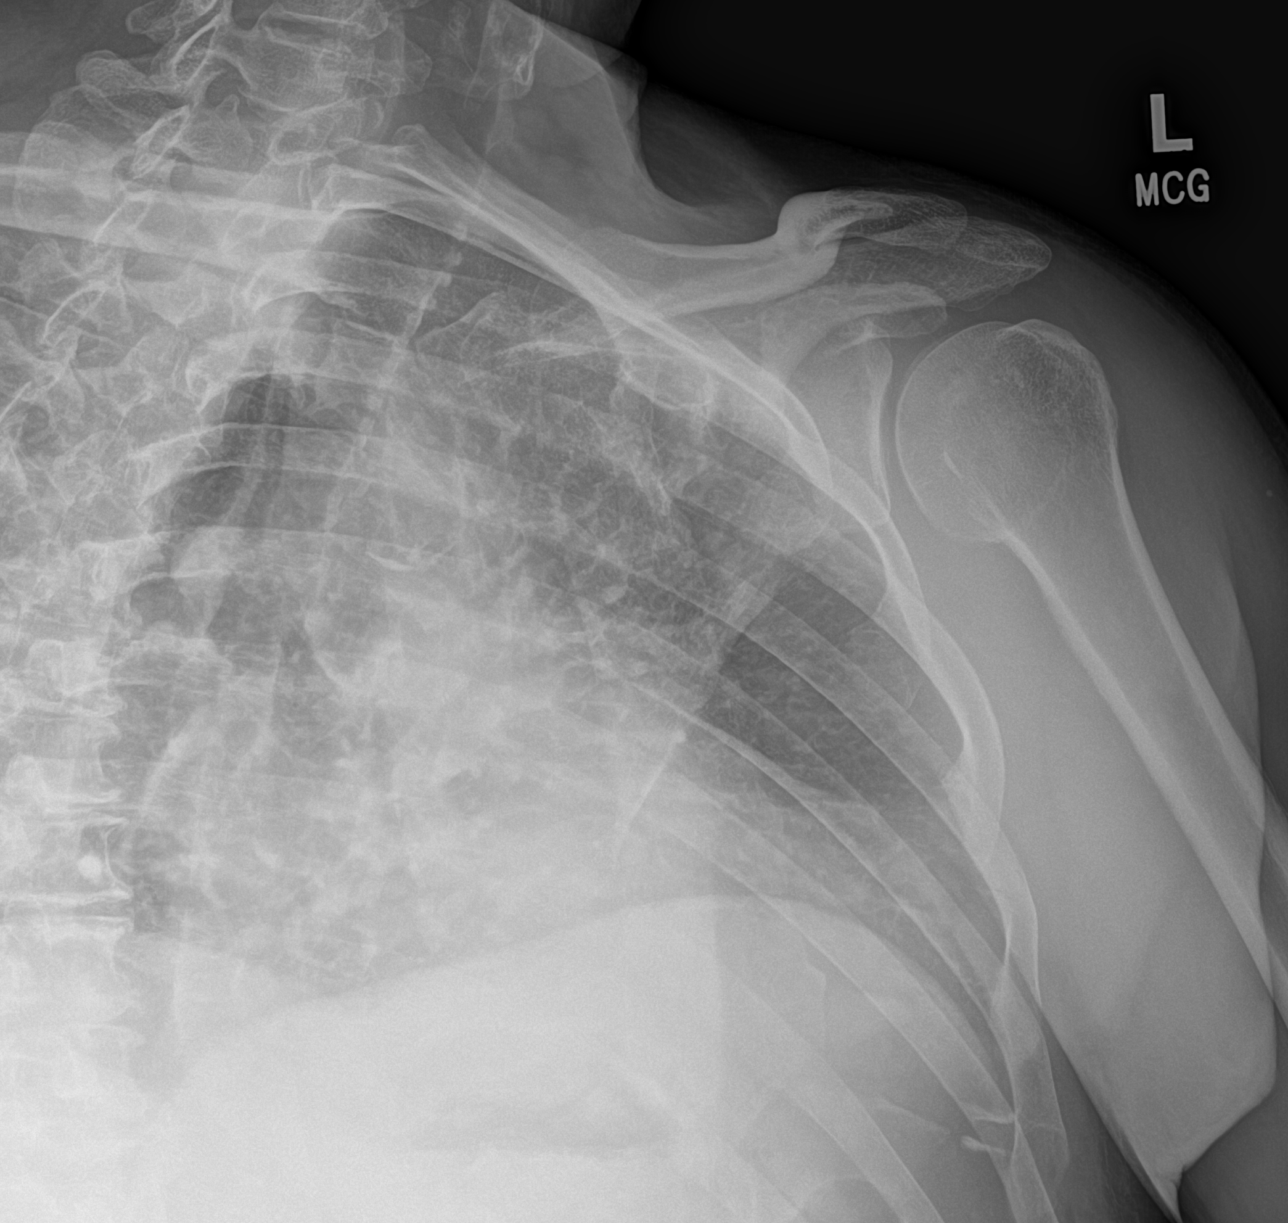

[shoulder ap (2 of 2)]
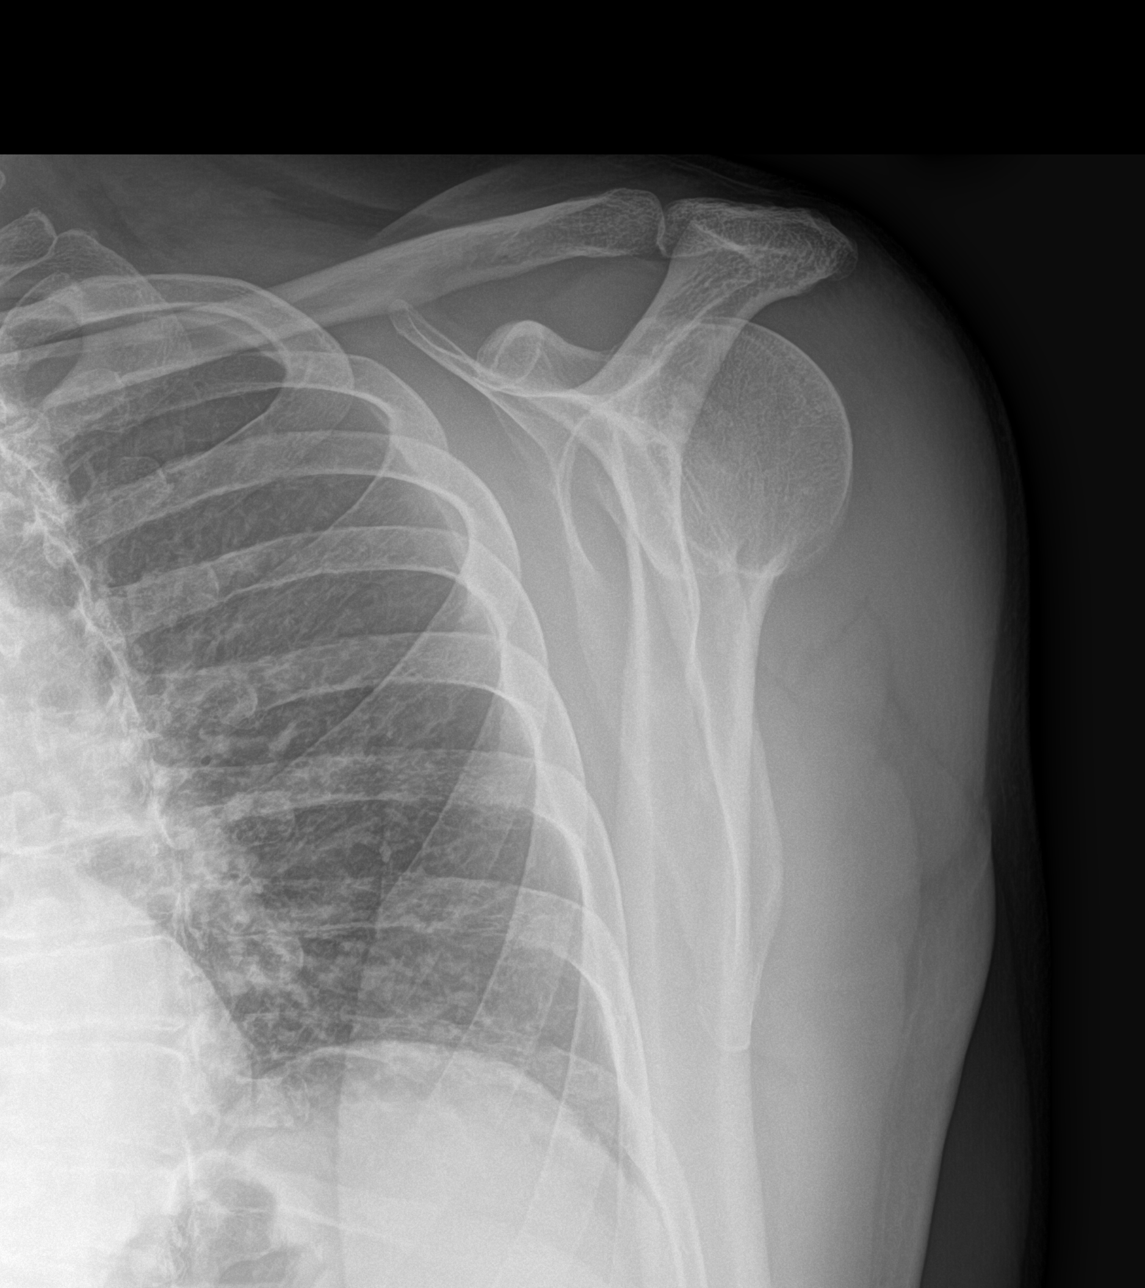

[shoulder axial]
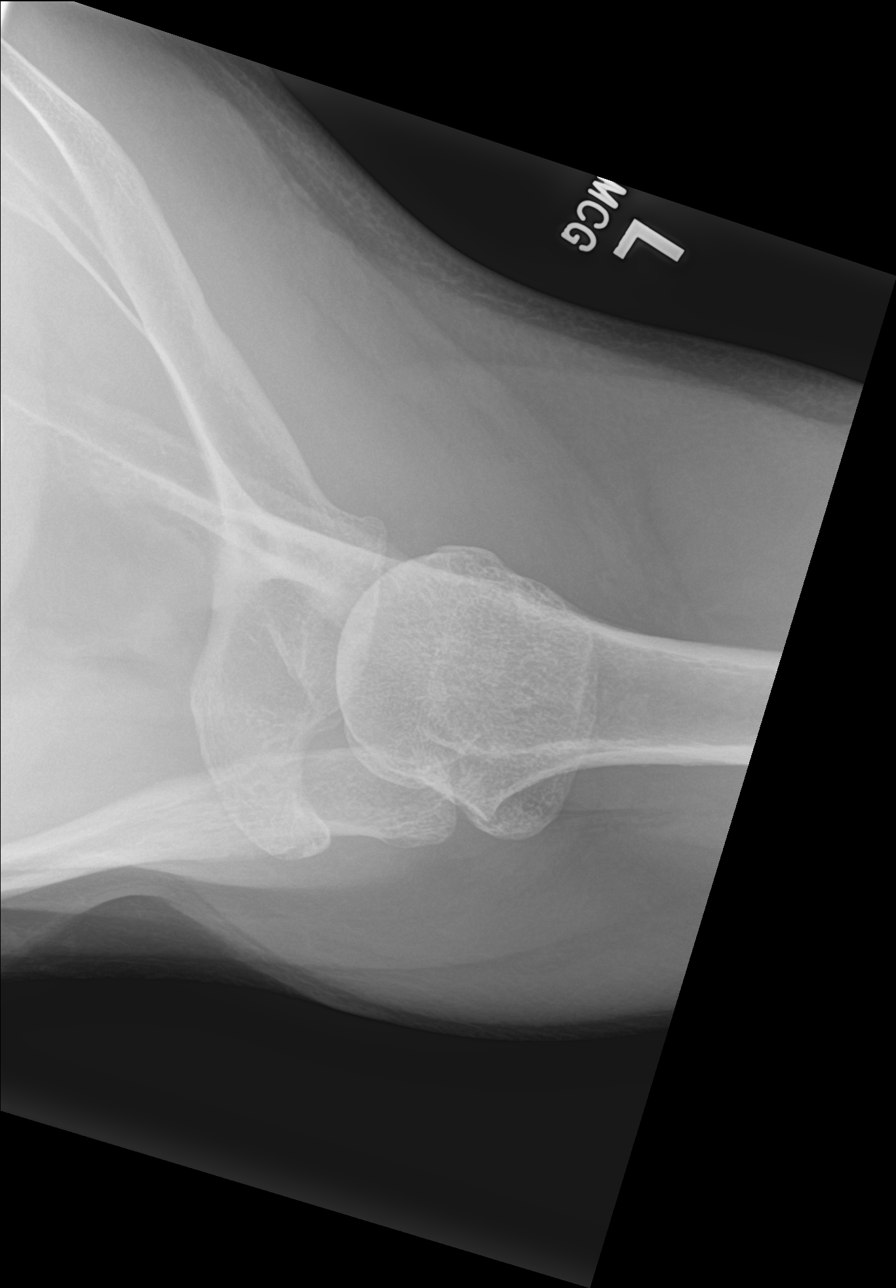

[3 of 3 positions shown; findings below may reference images not displayed]

FINDINGS: No acute fracture or dislocation. Joint spaces and alignment are
maintained. No area of erosion or osseous destruction. No unexpected
radiopaque foreign body. Soft tissues are unremarkable.
IMPRESSION: No acute fracture or dislocation.

## 2024-05-27 ENCOUNTER — Other Ambulatory Visit

## 2024-06-03 ENCOUNTER — Ambulatory Visit
